# Patient Record
Sex: Female | Born: 1955 | Race: White | Hispanic: No | Marital: Married
Health system: Midwestern US, Community
[De-identification: ages and names within clinical notes are randomized; demographics above are authoritative.]

## PROBLEM LIST (undated history)

## (undated) DIAGNOSIS — M130 Polyarthritis, unspecified: Secondary | ICD-10-CM

---

## 2010-12-07 LAB — LIPID PANEL
Chol/HDL Ratio: 3.5 (ref <5.0)
Cholesterol: 211 mg/dL — ABNORMAL HIGH (ref <200)
HDL: 61 mg/dL (ref >40)
LDL Cholesterol: 124 mg/dL — ABNORMAL HIGH (ref <100)
Triglycerides: 128 mg/dL (ref <150)

## 2010-12-07 LAB — CBC WITH DIFFERENTIAL
Absolute Eos #: 0.2 10*3/uL (ref 0.0–0.4)
Absolute Lymph #: 1.8 10*3/uL (ref 1.0–4.8)
Absolute Mono #: 0.5 10*3/uL (ref 0.1–1.2)
Absolute Neut #: 3.7 10*3/uL (ref 1.8–7.7)
Basophils Absolute: 0 10*3/uL (ref 0.0–0.2)
Basophils: 1 % (ref 0–2)
Eosinophils %: 3 % (ref 1–4)
Hematocrit: 39.4 % (ref 36–46)
Hemoglobin: 13.1 g/dL (ref 12.0–16.0)
Lymphocytes: 29 % (ref 24–44)
MCH: 31.7 pg (ref 26–34)
MCHC: 33.2 g/dL (ref 31–37)
MCV: 95.5 fL (ref 80–100)
MPV: 9.7 fL (ref 6.0–12.0)
Monocytes: 8 % (ref 2–11)
Platelets: 171 10*3/uL (ref 140–450)
RBC: 4.13 m/uL (ref 4.0–5.2)
RDW: 14.2 % (ref 12.5–15.4)
Seg Neutrophils: 59 % (ref 36–66)
WBC: 6.3 10*3/uL (ref 3.5–11.0)

## 2010-12-07 LAB — COMPREHENSIVE METABOLIC PANEL
ALT: 16 U/L (ref 4–40)
AST: 22 U/L (ref 8–36)
Albumin/Globulin Ratio: 1.5 (ref 1.0–2.7)
Albumin: 4.1 g/dL (ref 3.4–4.8)
Alkaline Phosphatase: 67 U/L (ref 25–100)
Anion Gap: 8 mmol/L (ref 8–16)
BUN: 17 mg/dL (ref 6–20)
CO2: 31 mmol/L (ref 20–31)
Calcium: 9.1 mg/dL (ref 8.6–10.4)
Chloride: 106 mmol/L (ref 98–110)
Creatinine: 0.62 mg/dL (ref 0.4–1.0)
GFR African American: >60 mL/min (ref >60)
GFR Non-African American: >60 mL/min (ref >60)
Glucose: 84 mg/dL (ref 74–106)
Potassium: 4.4 mmol/L (ref 3.5–5.1)
Protein, Total: 6.8 g/dL (ref 6.4–8.3)
Sodium: 140 mmol/L (ref 136–145)
Total Bilirubin: 0.47 mg/dL (ref 0.3–1.2)

## 2010-12-07 LAB — T4, FREE: Thyroxine, Free: 1.14 ng/dL (ref 0.80–2.00)

## 2010-12-07 LAB — TSH: TSH: 1.45 mIU/L (ref 0.35–5.50)

## 2010-12-07 MED ORDER — DICLOFENAC SODIUM 75 MG PO TBEC
75 MG | ORAL_TABLET | Freq: Two times a day (BID) | ORAL | Status: DC
Start: 2010-12-07 — End: 2011-06-17

## 2010-12-07 NOTE — Patient Instructions (Signed)
Thank you for enrolling in MyChart. Please follow the instructions below to securely access your online medical record. MyChart allows you to send messages to your doctor, view your test results, renew your prescriptions, schedule appointments, and more.     How Do I Sign Up?  1. In your Internet browser, go to [email or web address]  2. Click on the Sign Up Now link in the Sign In box. You will see the New Member Sign Up page.  3. Enter your MyChart Access Code exactly as it appears below. You will not need to use this code after you???ve completed the sign-up process. If you do not sign up before the expiration date, you must request a new code.  MyChart Access Code: [email or web address]  Expires: 02/05/2011  8:59 AM    4. Enter your Social Security Number ([id number]) and Date of Birth (mm/dd/yyyy) as indicated and click Submit. You will be taken to the next sign-up page.  5. Create a MyChart ID. This will be your MyChart login ID and cannot be changed, so think of one that is secure and easy to remember.  6. Create a MyChart password. You can change your password at any time.  7. Enter your Password Reset Question and Answer. This can be used at a later time if you forget your password.   8. Enter your e-mail address. You will receive e-mail notification when new information is available in MyChart.  9. Click Sign Up. You can now view your medical record.     Additional Information  If you have questions, please contact your physician practice where you receive care. Remember, MyChart is NOT to be used for urgent needs. For medical emergencies, dial 911.

## 2010-12-07 NOTE — Progress Notes (Signed)
Subjective:      Patient ID: Rachael Mason is a 55 y.o. female.    HPI  Has been dieting on [employer] diet. Lost about 40 #. Feels good, some OA s/s.  Review of Systems   Constitutional: Negative.  Negative for unexpected weight change (purposeful dieting).   Respiratory: Negative.    Cardiovascular: Negative.    Gastrointestinal: Negative.    Genitourinary: Negative.    Musculoskeletal: Negative.    Skin: Negative.        Objective:   Physical Exam   Constitutional: She is oriented to person, place, and time. She appears well-developed.   HENT:   Head: Normocephalic.   Cardiovascular: Normal rate and normal heart sounds.    Pulmonary/Chest: Breath sounds normal.   Abdominal: Bowel sounds are normal.   Musculoskeletal: Normal range of motion. She exhibits no edema and no tenderness.   Neurological: She is alert and oriented to person, place, and time. She has normal reflexes.   Psychiatric: She has a normal mood and affect.       Assessment:      1. OA (osteoarthritis)    2. General medical exam    3. Weight loss    4. Palpitations            Plan:    Check labs  Renew diclofenac 75 bid

## 2010-12-09 NOTE — Telephone Encounter (Signed)
Informed lab results. Keep up current regimen.

## 2010-12-09 NOTE — Telephone Encounter (Signed)
Message copied by Lora Paula on Thu Dec 09, 2010  3:06 PM  ------       Message from: Rayne Du A       Created: Wed Dec 08, 2010  8:05 AM         Verall good labs. Cholest 211, but very good HDL. Keep up current regimen.

## 2011-06-17 MED ORDER — DICLOFENAC SODIUM 75 MG PO TBEC
75 MG | ORAL_TABLET | ORAL | Status: DC
Start: 2011-06-17 — End: 2012-03-05

## 2012-03-05 MED ORDER — DICLOFENAC SODIUM 75 MG PO TBEC
75 MG | ORAL_TABLET | ORAL | Status: DC
Start: 2012-03-05 — End: 2013-03-29

## 2012-08-03 MED ORDER — CEPHALEXIN 500 MG PO CAPS
500 MG | ORAL_CAPSULE | Freq: Three times a day (TID) | ORAL | Status: DC
Start: 2012-08-03 — End: 2013-01-30

## 2012-08-03 NOTE — Telephone Encounter (Signed)
Patient advised

## 2012-08-03 NOTE — Telephone Encounter (Signed)
Patient coughing up yellowish-green phlegm, glands are swollen, sore throat, ear pain. Please advise.    [facility]   [phone]

## 2013-01-29 MED ORDER — BENZONATATE 200 MG PO CAPS
200 MG | ORAL_CAPSULE | Freq: Three times a day (TID) | ORAL | Status: AC | PRN
Start: 2013-01-29 — End: 2013-02-08

## 2013-01-29 NOTE — Telephone Encounter (Signed)
Coughing up and blowing out green/yellow mucus, ears/teeth hurt, head congestion, sore throat.  Symptoms started Saturday 01-26-13.  Requesting an RX for cough syrup with codeine.    [facility], [place]

## 2013-01-29 NOTE — Telephone Encounter (Signed)
Patient has been notified and Erskine Squibb will make the appointment

## 2013-01-29 NOTE — Telephone Encounter (Signed)
Tessalon is sent in.    Make appt with TAB in am

## 2013-01-30 MED ORDER — HYDROCODONE-HOMATROPINE 5-1.5 MG/5ML PO SYRP
Freq: Four times a day (QID) | ORAL | Status: DC | PRN
Start: 2013-01-30 — End: 2013-02-18

## 2013-01-30 MED ORDER — DOXYCYCLINE HYCLATE 100 MG PO CAPS
100 MG | ORAL_CAPSULE | Freq: Two times a day (BID) | ORAL | Status: DC
Start: 2013-01-30 — End: 2013-02-18

## 2013-01-30 NOTE — Progress Notes (Signed)
Subjective:      Patient ID: Rachael Mason is a 57 y.o. female.    HPI  Sinus, chest congestion. Otalgia, production of yellow green. Discussed.  PMH reviewed.  Review of Systems   Constitutional: Positive for fever, chills and diaphoresis.   HENT: Positive for congestion, sore throat, rhinorrhea, postnasal drip and sinus pressure.    Respiratory: Positive for cough (prod, worse supine.).    Cardiovascular: Negative.    Gastrointestinal: Negative.    Genitourinary: Negative.    Musculoskeletal: Negative.    Skin: Negative.    Neurological: Negative.    Psychiatric/Behavioral: Negative.        Objective:   Physical Exam   Constitutional: She is oriented to person, place, and time. She appears well-developed and well-nourished.   HENT:   Head: Normocephalic and atraumatic.   Right Ear: External ear normal.   Left Ear: External ear normal.   Nose: Nose normal.   Mouth/Throat: Oropharynx is clear and moist.   Eyes: Conjunctivae and EOM are normal. Pupils are equal, round, and reactive to light.   Neck: Normal range of motion. Neck supple.   Cardiovascular: Normal rate, normal heart sounds and intact distal pulses.    Pulmonary/Chest: Effort normal.   Scattered rhonchi     Abdominal: Soft. Bowel sounds are normal.   Musculoskeletal: Normal range of motion.   Neurological: She is alert and oriented to person, place, and time. She has normal reflexes.   Skin: Skin is warm and dry.   Psychiatric: She has a normal mood and affect.       Assessment:       1. Sinusitis    2. ROM (right otitis media)    3. URI (upper respiratory infection)    4. Pharyngitis              Plan:    Must stop smoking  Hycodan liq  Doxycycline 100 BID 10 D

## 2013-01-30 NOTE — Progress Notes (Signed)
Have you seen any other physician or provider since your last visitno-     Have you had any other diagnostic tests since your last visit? no    Have you changed or stopped any medications since your last visit including any over-the-counter medicines, vitamins, or herbal medicines? no     Are you taking all your prescribed medications? Yes  If NO, why? -  N/A           Patient Self-Management Goal for this visit.   What is your goal for your visit today? - symptom relief   Barriers to success: none   Plan for overcoming my barriers: N/A      Confidence: 10/10   Date goal set: 01/30/2013   Date expected to reach goal: 1day      Health Maintenance Due   Topic Date Due   ??? TETANUS VACCINE ADULT (11 YEARS AND UP)  08/21/1965   ??? CERVICAL CANCER SCREENING  08/22/1975   ??? BREAST CANCER SCREENING  08/22/1994   ??? FLU VACCINE YEARLY (ADULT)  01/21/2013

## 2013-02-18 LAB — COMPREHENSIVE METABOLIC PANEL
ALT: 9 U/L (ref 5–33)
AST: 23 U/L (ref <32)
Albumin/Globulin Ratio: 1.4 (ref 1.0–2.5)
Albumin: 4.3 g/dL (ref 3.5–5.2)
Alkaline Phosphatase: 65 U/L (ref 35–104)
Anion Gap: 17 mmol/L — ABNORMAL HIGH (ref 8–16)
BUN: 15 mg/dL (ref 6–20)
CO2: 23 mmol/L (ref 20–31)
Calcium: 9.1 mg/dL (ref 8.6–10.4)
Chloride: 101 mmol/L (ref 98–107)
Creatinine: 0.55 mg/dL (ref 0.50–0.90)
GFR African American: >60 mL/min (ref >60)
GFR Non-African American: >60 mL/min (ref >60)
Glucose: 85 mg/dL (ref 70–99)
Potassium: 4.3 mmol/L (ref 3.5–5.1)
Sodium: 137 mmol/L (ref 133–142)
Total Bilirubin: 0.4 mg/dL (ref 0.3–1.2)
Total Protein: 7.4 g/dL (ref 6.4–8.3)

## 2013-02-18 LAB — LIPID PANEL
Chol/HDL Ratio: 2.8 (ref <5)
Cholesterol: 210 mg/dL — ABNORMAL HIGH (ref <200)
HDL: 75 mg/dL (ref >40)
LDL Cholesterol: 112 mg/dL (ref 0–130)
Triglycerides: 115 mg/dL (ref <150)

## 2013-02-18 MED ORDER — ESTROGENS, CONJUGATED 0.625 MG/GM VA CREA
0.625 MG/GM | VAGINAL | Status: DC
Start: 2013-02-18 — End: 2013-03-26

## 2013-02-18 NOTE — Progress Notes (Signed)
Subjective:      Patient ID: Rachael Mason is a 57 y.o. female.    HPI  Here for annual exam and Pap. Discussed bone density. Also, dyspareunia in recent times - is 6 years post-menpausal. Notes hip pain. Discussed.  PMH reviewed.  Review of Systems   Constitutional: Negative.  Negative for fatigue.   HENT: Negative.    Respiratory: Negative.    Cardiovascular: Negative.    Gastrointestinal: Negative.    Genitourinary: Negative.    Musculoskeletal: Positive for arthralgias (bilateral hips - seems to be less painful when exercises.).   Skin: Negative.    Neurological: Negative.    Psychiatric/Behavioral: Negative.        Objective:   Physical Exam   Constitutional: She is oriented to person, place, and time. She appears well-developed and well-nourished.   HENT:   Head: Normocephalic and atraumatic.   Right Ear: External ear normal.   Left Ear: External ear normal.   Nose: Nose normal.   Mouth/Throat: Oropharynx is clear and moist.   Eyes: Conjunctivae and EOM are normal. Pupils are equal, round, and reactive to light.   Neck: Normal range of motion. Neck supple.   Cardiovascular: Normal rate, normal heart sounds and intact distal pulses.    Pulmonary/Chest: Effort normal and breath sounds normal.   Abdominal: Soft. Bowel sounds are normal.   Genitourinary: Vagina normal. No vaginal discharge found.   Some thinning of vaginal tissue      Musculoskeletal: Normal range of motion.   Neurological: She is alert and oriented to person, place, and time. She has normal reflexes.   Skin: Skin is warm.   Psychiatric: She has a normal mood and affect. Thought content normal.       Assessment:       1. Well adult exam     2. Screening  PAP SMEAR    MAM Screen Routine   3. Osteopenia  DEXA Bone Density 2 Sites   4. Hyperlipidemia  Lipid Panel    Comprehensive Metabolic Panel   5. Papanicolaou smear, as part of routine gynecological examination               Plan:    Prem vag cream 2-3 x weekly.  Mammogram   DEXA scan  Check  labs  Flu Shot

## 2013-02-18 NOTE — Progress Notes (Signed)
Have you seen any other physician or provider since your last visit no    Have you had any other diagnostic tests since your last visit? no    Have you changed or stopped any medications since your last visit including any over-the-counter medicines, vitamins, or herbal medicines? no     Are you taking all your prescribed medications? Yes  If NO, why? -             Patient Self-Management Goal for this visit.   What is your goal for your visit today? - pap and PE   Barriers to success: none   Plan for overcoming my barriers: N/A      Confidence: 10/10   Date goal set: 02/18/2013   Date expected to reach goal: today      Health Maintenance Due   Topic Date Due   ??? TETANUS VACCINE ADULT (11 YEARS AND UP)  08/21/1965   ??? CERVICAL CANCER SCREENING  08/22/1975   ??? BREAST CANCER SCREENING  08/22/1994   ??? FLU VACCINE YEARLY (ADULT)  01/21/2013

## 2013-03-04 LAB — GYN CYTOLOGY

## 2013-03-06 MED ORDER — ALENDRONATE SODIUM 70 MG PO TABS
70 MG | ORAL_TABLET | ORAL | Status: DC
Start: 2013-03-06 — End: 2013-03-26

## 2013-03-26 MED ORDER — ESTROGENS, CONJUGATED 0.625 MG/GM VA CREA
0.625 MG/GM | VAGINAL | Status: DC
Start: 2013-03-26 — End: 2014-03-20

## 2013-03-26 MED ORDER — ALENDRONATE SODIUM 70 MG PO TABS
70 MG | ORAL_TABLET | ORAL | Status: DC
Start: 2013-03-26 — End: 2014-02-11

## 2013-03-29 MED ORDER — DICLOFENAC SODIUM 75 MG PO TBEC
75 MG | ORAL_TABLET | ORAL | Status: DC
Start: 2013-03-29 — End: 2014-03-01

## 2014-02-11 MED ORDER — ALENDRONATE SODIUM 70 MG PO TABS
70 MG | ORAL_TABLET | ORAL | Status: DC
Start: 2014-02-11 — End: 2014-03-20

## 2014-02-11 NOTE — Telephone Encounter (Signed)
02/18/2013 LV

## 2014-03-03 MED ORDER — DICLOFENAC SODIUM 75 MG PO TBEC
75 MG | ORAL_TABLET | Freq: Two times a day (BID) | ORAL | Status: DC
Start: 2014-03-03 — End: 2014-03-20

## 2014-03-03 NOTE — Telephone Encounter (Signed)
02/18/2013 LV

## 2014-03-05 MED ORDER — AMOXICILLIN 875 MG PO TABS
875 MG | ORAL_TABLET | Freq: Two times a day (BID) | ORAL | Status: DC
Start: 2014-03-05 — End: 2014-08-07

## 2014-03-05 NOTE — Telephone Encounter (Signed)
Chest and head congestion, bad cough green phlegm.  [facility]  [phone]

## 2014-03-20 ENCOUNTER — Ambulatory Visit: Admit: 2014-03-20 | Discharge: 2014-03-20 | Payer: BLUE CROSS/BLUE SHIELD | Attending: Family Medicine

## 2014-03-20 DIAGNOSIS — Z0000 Encounter for general adult medical examination without abnormal findings: Secondary | ICD-10-CM

## 2014-03-20 MED ORDER — DICLOFENAC SODIUM 75 MG PO TBEC
75 MG | ORAL_TABLET | Freq: Two times a day (BID) | ORAL | Status: DC
Start: 2014-03-20 — End: 2015-08-18

## 2014-03-20 MED ORDER — HYDROCODONE-ACETAMINOPHEN 5-325 MG PO TABS
5-325 MG | ORAL_TABLET | Freq: Every evening | ORAL | Status: DC | PRN
Start: 2014-03-20 — End: 2015-11-11

## 2014-03-20 MED ORDER — ALENDRONATE SODIUM 70 MG PO TABS
70 MG | ORAL_TABLET | ORAL | Status: DC
Start: 2014-03-20 — End: 2015-11-11

## 2014-03-20 NOTE — Progress Notes (Signed)
Subjective:      Patient ID: Rachael Mason is a 58 y.o. female.    HPI  Annual Exam - was ill with a sinus and chest cold. Completed course of Amoxil. Resolved. Bilateral hip pain and stiffness - worsening over the past couple years. Trouble sleeping. Diclofenac will help some.  Discussed.    Review of Systems:   Constitutional: no fever or weight loss    Eyes: no eye pain or blurred vision  ENT: no hearing loss, congestion, or difficulty swallowing  Respiratory: no wheezing, chest tightness, or shortness of breath    Cardiovascular: no chest pain or pressure, no palpitations, no diaphoresis   Gastrointestinal: no nausea, vomiting, or diarrhea     Genitourinary: no dysuria, frequency, or nocturia    Musculoskeletal: Bilateral hip pain and stiffness, steadily increasing over the past several months.    Skin: no rashes or sores   Neurological: no weakness, numbness, tingling, or headache   Hematological: no bleeding gums or easy bruising   Psychiatric/Behavioral: no depression or anxiety      Allergies; medications; past medical, surgical, family, and social history; and problem list reviewed as indicated in this encounter.      Physical Exam:   Vitals: Blood pressure 120/80, pulse 88, height 5\' 6"  (1.676 m), weight 137 lb (62.143 kg), SpO2 98 %.  Constitutional: She is oriented to person, place, and time.  She appears well-developed and well-nourished and in no acute distress.   Head: Normocephalic and atraumatic.   Eyes: EOM are normal. Pupils are equal, round, and reactive to light.   Right Ear: External ear normal.   Left Ear: External ear normal.   Nose: pink, non-edematous mucosa.   Throat: no erythema, tonsillar hypertrophy or exudate  Neck: Normal range of motion. Neck supple. No tracheal deviation present.   Cardiovascular: Normal rate and regular rhythm, no murmur, rub, or gallop    Pulmonary/Chest: Effort normal and breath sounds normal. No rales or wheezes.  No chest retraction.  Abdomen: Soft. No  tenderness.   Musculoskeletal: Normal gait and station.  Bilateral lateral hip area tenderness and stiffness with range of motion.  Extremities: no clubbing, cyanosis, or edema   Neurological: CN II-XII grossly intact, no focal neurological deficits   Skin: Skin is warm and dry.  No obvious lesion on exposed skin.   Psychiatric: mood appears euthymic, affect appears normal, she does not appear to be responding to internal stimuli.    Review of Systems    Objective:   Physical Exam    Assessment:       1. Well adult exam     2. Pain of both hip joints  XR Hip Bilateral Standard W Ap Pelvis   3. Primary osteoarthritis of both hips     4. Osteopenia           Plan:   Current meds. Renew.  Advised quitting smoking - down to about 1/2 ppd.  Baseline hip xrays.   Norco HS  ROM and stretching exercises.

## 2014-03-20 NOTE — Progress Notes (Signed)
Have you seen any other physician or provider since your last visit no    Have you had any other diagnostic tests since your last visit? no    Have you changed or stopped any medications since your last visit including any over-the-counter medicines, vitamins, or herbal medicines? no     Are you taking all your prescribed medications? Yes  If NO, why? -  N/A           Patient Self-Management Goal for this visit.   What is your goal for your visit today? - physical   Barriers to success: none   Plan for overcoming my barriers: N/A      Confidence: 10/10   Date goal set: 03/20/14   Date expected to reach goal: 1day      Health Maintenance Due   Topic Date Due   ??? TETANUS VACCINE ADULT (11 YEARS AND UP)  08/21/1965   ??? PNEUMOVAX 1 DOSE 19-64Y (1) 08/21/1972   ??? FLU VACCINE YEARLY (ADULT)  12/21/2013   ??? BREAST CANCER SCREENING  03/05/2014

## 2014-08-07 MED ORDER — AMOXICILLIN 875 MG PO TABS
875 MG | ORAL_TABLET | Freq: Two times a day (BID) | ORAL | Status: DC
Start: 2014-08-07 — End: 2015-07-27

## 2014-08-07 NOTE — Telephone Encounter (Signed)
Patient reports sore throat, swollen glands in neck, yellow nasal drainage, sinus pressure, productive cough for 3 days. Patient has taken Sudafed. She has NKDA. She is requesting [facility] at[facility] [place]

## 2015-07-27 MED ORDER — AMOXICILLIN 875 MG PO TABS
875 MG | ORAL_TABLET | Freq: Two times a day (BID) | ORAL | 1 refills | Status: DC
Start: 2015-07-27 — End: 2015-11-11

## 2015-07-27 NOTE — Telephone Encounter (Signed)
Amoxicillin sent to her phcy.

## 2015-07-27 NOTE — Telephone Encounter (Signed)
PT IS IN [place].  SHE HAS HEAD CONGESTION DOWN INTO CHEST. HAS BEEN TAKING DAYQUIL, NYQUIL.  SHE IS COUGHING, HAS FEVER OFF AND  ON.  SHE HAS SHARP HEADACE PAIN, SHE CAN'T SEE VERY WELL. WILL YOU CALL SOMETHING IN FOR HER?  [facility] [phone]

## 2015-07-27 NOTE — Telephone Encounter (Signed)
NO ANSWER, PT WAS ADVISED TO CALL PHARMACY AROUND NOON TODAY

## 2015-08-18 MED ORDER — DICLOFENAC SODIUM 75 MG PO TBEC
75 MG | ORAL_TABLET | ORAL | 0 refills | Status: DC
Start: 2015-08-18 — End: 2017-08-10

## 2015-08-18 NOTE — Telephone Encounter (Signed)
Health Maintenance   Topic Date Due   ??? Hepatitis C screen  [birth year]   ??? HIV screen  08/21/1969   ??? DTaP/Tdap/Td vaccine (1 - Tdap) 08/21/1973   ??? Pneumococcal med risk (1 of 1 - PPSV23) 08/21/1973   ??? Zostavax vaccine  08/22/2014   ??? Colon cancer screen colonoscopy  10/01/2014   ??? Flu vaccine (1) 12/22/2014   ??? Breast cancer screen  03/06/2015   ??? Cervical cancer screen  02/19/2016   ??? Lipid screen  02/18/2018       No results found for: LABA1C          ( goal A1C is < 7)   No results found for: LABMICR  LDL Cholesterol (mg/dL)   Date Value   19/14/782909/29/2014 112       (goal LDL is <100)   AST (U/L)   Date Value   02/18/2013 23     ALT (U/L)   Date Value   02/18/2013 9     BUN (mg/dL)   Date Value   56/21/308609/29/2014 15     BP Readings from Last 3 Encounters:   03/20/14 120/80   02/18/13 122/66   01/30/13 110/70          (goal 120/80)    All Future Testing planned in [facility]      Next Visit Date:  No future appointments.         Patient Active Problem List:     Osteopenia     Primary osteoarthritis of both hips

## 2015-11-11 ENCOUNTER — Ambulatory Visit: Admit: 2015-11-11 | Discharge: 2015-11-11 | Payer: BLUE CROSS/BLUE SHIELD

## 2015-11-11 DIAGNOSIS — M8580 Other specified disorders of bone density and structure, unspecified site: Secondary | ICD-10-CM

## 2015-11-11 MED ORDER — OSPEMIFENE 60 MG PO TABS
60 MG | ORAL_TABLET | Freq: Every day | ORAL | 3 refills | Status: DC
Start: 2015-11-11 — End: 2016-03-09

## 2015-11-11 MED ORDER — HYDROCODONE-ACETAMINOPHEN 5-325 MG PO TABS
5-325 MG | ORAL_TABLET | Freq: Three times a day (TID) | ORAL | 0 refills | Status: DC | PRN
Start: 2015-11-11 — End: 2017-03-06

## 2015-11-11 NOTE — Progress Notes (Signed)
Subjective:      Patient ID: Rachael Mason is a 60 y.o. female.    HPI  Here for follow up of hip pain (which is well controlled with the voltaren)  and has some concerns about screening and preventive testing.      Review of Systems   Constitutional: Negative for activity change, appetite change, chills, diaphoresis, fatigue, fever and unexpected weight change.   HENT: Negative for congestion, ear pain, facial swelling, hearing loss, postnasal drip, rhinorrhea, sinus pressure, sore throat and voice change.    Eyes: Negative for photophobia, pain, discharge, redness, itching and visual disturbance.   Respiratory: Negative for cough, chest tightness, shortness of breath and wheezing.    Cardiovascular: Negative for chest pain and palpitations.   Gastrointestinal: Negative for abdominal distention, abdominal pain, blood in stool, constipation, diarrhea, nausea and vomiting.   Endocrine: Negative for cold intolerance and heat intolerance.   Genitourinary: Negative for decreased urine volume, difficulty urinating, dysuria, flank pain, frequency, hematuria, pelvic pain, urgency, vaginal bleeding and vaginal discharge.   Musculoskeletal: Positive for arthralgias. Negative for back pain, gait problem, joint swelling, myalgias, neck pain and neck stiffness.   Skin: Negative for color change, pallor and rash.   Allergic/Immunologic: Negative for environmental allergies and food allergies.   Neurological: Negative for dizziness, tremors, seizures, syncope, facial asymmetry, speech difficulty, weakness, numbness and headaches.   Psychiatric/Behavioral: Negative for agitation, behavioral problems, dysphoric mood and sleep disturbance. The patient is not nervous/anxious and is not hyperactive.        Objective:   Physical Exam   Constitutional: She is oriented to person, place, and time. She appears well-developed and well-nourished. She does not appear ill. No distress.   HENT:   Head: Normocephalic and atraumatic.   Right  Ear: External ear normal.   Left Ear: External ear normal.   Nose: Nose normal. No mucosal edema or rhinorrhea.   Mouth/Throat: Mucous membranes are normal. No oropharyngeal exudate, posterior oropharyngeal edema or posterior oropharyngeal erythema.   Eyes: EOM are normal. Pupils are equal, round, and reactive to light. Right eye exhibits no discharge. Left eye exhibits no discharge. No scleral icterus.   Neck: Trachea normal and normal range of motion. Neck supple. No JVD present. Carotid bruit is not present. No tracheal deviation present. No thyromegaly present.   Cardiovascular: Normal rate, regular rhythm, S1 normal, S2 normal, normal heart sounds and normal pulses.  Exam reveals no gallop, no S3, no S4, no distant heart sounds and no friction rub.    No murmur heard.  Pulmonary/Chest: No respiratory distress. She has no decreased breath sounds. She has no wheezes. She has no rhonchi. She has no rales.   Abdominal: Soft. Normal appearance and bowel sounds are normal. There is no hepatosplenomegaly. There is no tenderness. There is no CVA tenderness. No hernia.   Musculoskeletal:        Right hip: She exhibits decreased range of motion, decreased strength, tenderness and crepitus. She exhibits no deformity.        Left hip: She exhibits decreased range of motion, decreased strength, tenderness and crepitus. She exhibits no deformity.   Lymphadenopathy:     She has no cervical adenopathy.        Right cervical: No superficial cervical adenopathy present.       Left cervical: No superficial cervical adenopathy present.   Neurological: She is alert and oriented to person, place, and time. She has normal strength. No cranial nerve deficit or sensory  deficit. Coordination and gait normal.   Reflex Scores:       Brachioradialis reflexes are 2+ on the right side and 2+ on the left side.       Patellar reflexes are 2+ on the right side and 2+ on the left side.       Achilles reflexes are 2+ on the right side and 2+ on  the left side.  Skin: Skin is warm and dry. No lesion and no rash noted. She is not diaphoretic. No cyanosis. Nails show no clubbing.   Psychiatric: She has a normal mood and affect. Her speech is normal and behavior is normal. Judgment and thought content normal. Cognition and memory are normal.       Assessment:          Plan:      1. Osteopenia  - DEXA BONE DENSITY 2 SITES; Future    2. Screen for colon cancer  - External Referral To Gastroenterology    3. Pain of both hip joints  Refilled norco for TID dosing.

## 2015-11-11 NOTE — Progress Notes (Signed)
Subjective:      Patient ID: Rachael Mason is a 60 y.o. female.  Visit Information    Have you changed or started any medications since your last visit including any over-the-counter medicines, vitamins, or herbal medicines? no   Are you having any side effects from any of your medications? -  no  Have you stopped taking any of your medications? Is so, why? -  no    Have you seen any other physician or provider since your last visit? No  Have you had any other diagnostic tests since your last visit? No  Have you been seen in the emergency room and/or had an admission to a hospital since we last saw you? No  Have you had your routine dental cleaning in the past 6 months? yes -     Have you activated your MyChart account? If not, what are your barriers? No:      Patient Care Team:  Lenn SinkBradley P Everly, MD as PCP - General    Medical History Review  Past Medical, Family, and Social History reviewed and  contribute to the patient presenting condition    Health Maintenance   Topic Date Due   ??? Hepatitis C screen  08-09-1955   ??? HIV screen  08/21/1969   ??? DTaP/Tdap/Td vaccine (1 - Tdap) 08/21/1973   ??? Pneumococcal med risk (1 of 1 - PPSV23) 08/21/1973   ??? Zostavax vaccine  08/22/2014   ??? Colon cancer screen colonoscopy  10/01/2014   ??? Breast cancer screen  03/06/2015   ??? Flu vaccine (Season Ended) 12/22/2015   ??? Cervical cancer screen  02/19/2016   ??? Lipid screen  02/18/2018       HPI    Review of Systems    Objective:   Physical Exam    Assessment / Plan:

## 2015-11-11 NOTE — Patient Instructions (Signed)
In order to reduce smoking try the following:  -Find a place for your cigarettes to "live" and make sure they always stay there and at the same time pick a separate place where you smoke them.  The idea here is that you never want them to be easily accessible so that you actually have to get up and walk somewhere to get one if you want it.    -Try to keep something handy that you can keep in your mouth for instance a toothpick or better yet a bag of dum dum pops that you can keep in your mouth to take the place of the cigarette.    -third, before each cigarette drink about 6-8 ounces of water.  There are two reasons to this, one is that dehydration causes nicotine cravings and the other is that because the average nicotine craving lasts about 2 minutes, in the time it takes to get the drink, you may not want the cigarette anymore.

## 2016-02-08 MED ORDER — CEFDINIR 300 MG PO CAPS
300 MG | ORAL_CAPSULE | Freq: Two times a day (BID) | ORAL | 0 refills | Status: AC
Start: 2016-02-08 — End: 2016-02-18

## 2016-02-08 MED ORDER — PREDNISONE 50 MG PO TABS
50 MG | ORAL_TABLET | Freq: Every day | ORAL | 0 refills | Status: AC
Start: 2016-02-08 — End: 2016-02-18

## 2016-02-08 NOTE — Telephone Encounter (Signed)
Made patient aware

## 2016-02-08 NOTE — Telephone Encounter (Signed)
Submitted.

## 2016-02-08 NOTE — Telephone Encounter (Signed)
she is wanting to know if you can call something in [facility] Friday coughin up green and yellow please advise

## 2016-03-09 ENCOUNTER — Ambulatory Visit: Admit: 2016-03-09 | Discharge: 2016-03-09 | Payer: BLUE CROSS/BLUE SHIELD

## 2016-03-09 DIAGNOSIS — Z0000 Encounter for general adult medical examination without abnormal findings: Secondary | ICD-10-CM

## 2016-03-09 MED ORDER — ESTRADIOL 0.1 MG/GM VA CREA
0.1 | VAGINAL | 3 refills | Status: AC
Start: 2016-03-09 — End: ?

## 2016-03-09 NOTE — Progress Notes (Signed)
Subjective:      Patient ID: Rachael Mason is a 60 y.o. female.    HPI   Here for annual well check and has been generally well and healthy and has no current concerns or complaints.      She has continued to have dyspareunia and dryness.       Her diet has been generally healthy and has been staying well hydrated.  She has been sleeping well and has been staying active.       Review of Systems   Constitutional: Negative for activity change, appetite change, chills, diaphoresis, fatigue, fever and unexpected weight change.   HENT: Positive for congestion. Negative for ear pain, facial swelling, hearing loss, postnasal drip, rhinorrhea, sinus pressure, sore throat and voice change.    Eyes: Negative for photophobia, pain, discharge, redness, itching and visual disturbance.   Respiratory: Positive for cough. Negative for chest tightness, shortness of breath and wheezing.    Cardiovascular: Negative for chest pain and palpitations.   Gastrointestinal: Negative for abdominal distention, abdominal pain, blood in stool, constipation, diarrhea, nausea and vomiting.   Endocrine: Negative for cold intolerance and heat intolerance.   Genitourinary: Negative for decreased urine volume, difficulty urinating, dysuria, flank pain, frequency, hematuria, pelvic pain, urgency, vaginal bleeding and vaginal discharge.   Musculoskeletal: Negative for arthralgias, back pain, gait problem, joint swelling, myalgias, neck pain and neck stiffness.   Skin: Negative for color change, pallor and rash.   Allergic/Immunologic: Negative for environmental allergies and food allergies.   Neurological: Negative for dizziness, tremors, seizures, syncope, facial asymmetry, speech difficulty, weakness, numbness and headaches.   Psychiatric/Behavioral: Negative for agitation, behavioral problems, dysphoric mood and sleep disturbance. The patient is not nervous/anxious and is not hyperactive.        Objective:   Physical Exam   Constitutional: She is  oriented to person, place, and time. She appears well-developed and well-nourished. She does not appear ill. No distress.   HENT:   Head: Normocephalic and atraumatic.   Right Ear: External ear normal.   Left Ear: External ear normal.   Nose: Nose normal. No mucosal edema or rhinorrhea.   Mouth/Throat: Mucous membranes are normal. No oropharyngeal exudate, posterior oropharyngeal edema or posterior oropharyngeal erythema.   Eyes: EOM are normal. Pupils are equal, round, and reactive to light. Right eye exhibits no discharge. Left eye exhibits no discharge. No scleral icterus.   Neck: Trachea normal and normal range of motion. Neck supple. No JVD present. Carotid bruit is not present. No tracheal deviation present. No thyromegaly present.   Cardiovascular: Normal rate, regular rhythm, S1 normal, S2 normal, normal heart sounds and normal pulses.  Exam reveals no gallop, no S3, no S4, no distant heart sounds and no friction rub.    No murmur heard.  Pulmonary/Chest: No respiratory distress. She has no decreased breath sounds. She has no wheezes. She has no rhonchi. She has no rales.   Abdominal: Soft. Normal appearance and bowel sounds are normal. There is no hepatosplenomegaly. There is no tenderness. There is no CVA tenderness. No hernia.   Lymphadenopathy:     She has no cervical adenopathy.        Right cervical: No superficial cervical adenopathy present.       Left cervical: No superficial cervical adenopathy present.   Neurological: She is alert and oriented to person, place, and time. She has normal strength. No cranial nerve deficit or sensory deficit. Coordination and gait normal.   Reflex Scores:  Brachioradialis reflexes are 2+ on the right side and 2+ on the left side.       Patellar reflexes are 2+ on the right side and 2+ on the left side.       Achilles reflexes are 2+ on the right side and 2+ on the left side.  Skin: Skin is warm and dry. No lesion and no rash noted. She is not diaphoretic. No  cyanosis. Nails show no clubbing.   Psychiatric: She has a normal mood and affect. Her speech is normal and behavior is normal. Judgment and thought content normal. Cognition and memory are normal.          Vitals:    03/09/16 1344   BP: 120/72   Pulse: 68   Weight: 139 lb 6.4 oz (63.2 kg)   Height: 5' 4.5" (1.638 m)       Assessment:          Plan:      1. Routine general medical examination at a health care facility  - I generally recommend that people of all ages try to get 150 minutes of physical activity per week and it doesn???t matter how this totals up, in other words 30 minutes 5 days per week is as good as 50 minutes 3 days a week and so on.  The level of activity should be such that it is able to get your heart rate up to 100 or more, for example a brisk walk should achieve this rate.   - In terms of diet, I generally recommend trying to avoid processed foods wherever possible (anything that comes in a can or a box) which can be achieved by sticking to the outside walls of the grocery store where generally you will find fresh fruits/vegetables, meats, dairy, and frozen foods.    - Try to avoid starches in the diet where possible and minimize bread, rice, potatoes, and pasta in the diet.  Specifically try to avoid gluten, which even in people that don???t have a frank allergy, causes havoc in the small intestine and alters absorption of nutrients which can in turn lead to obesity.   - Try to achieve a regular sleep schedule, waking and laying down at the same time each night.  Most people need 7 hours per night plus or minus 2 hours.  You will know that you???re getting enough because you will wake feeling refreshed and not need to sleep in to catch up on weekends.   - Make sure that you don???t neglect your skin, I recommend an SPF 30 or higher sun screen any time that you plan to be in the sun for more than 20 minutes, even in the winter or on cloudy days (keep in mind that UV light penetrates clouds and can  cause burns even on cloudy days).   - Finally, make sure that you always have something to look forward to whether this is a vacation, a special event, or just a weekend off work.  Having something to look forward to helps to maintain positive focus and is good for mental health.       2. Need for immunization against influenza  - INFLUENZA, QUADV, 3 YRS AND OLDER, IM, MDV, 0.5ML (FLUZONE QUADV)    3. Screening for lipid disorders  Ordered lipid profile.     4. Medication monitoring encounter  CMP ordered.

## 2016-03-09 NOTE — Progress Notes (Signed)
Visit Information    Have you changed or started any medications since your last visit including any over-the-counter medicines, vitamins, or herbal medicines? no   Are you having any side effects from any of your medications? -  no  Have you stopped taking any of your medications? Is so, why? -  no    Have you seen any other physician or provider since your last visit? No  Have you had any other diagnostic tests since your last visit? No  Have you been seen in the emergency room and/or had an admission to a hospital since we last saw you? No  Have you had your routine dental cleaning in the past 6 months? yes -     Have you activated your MyChart account? If not, what are your barriers? No:      Patient Care Team:  Lenn SinkBradley P Everly, MD as PCP - General    Medical History Review  Past Medical, Family, and Social History reviewed and  contribute to the patient presenting condition    Health Maintenance   Topic Date Due   ??? Hepatitis C screen  Jul 17, 1955   ??? HIV screen  08/21/1969   ??? DTaP/Tdap/Td vaccine (1 - Tdap) 08/21/1973   ??? Pneumococcal med risk (1 of 1 - PPSV23) 08/21/1973   ??? Zostavax vaccine  08/22/2014   ??? Colon cancer screen colonoscopy  10/01/2014   ??? Breast cancer screen  03/06/2015   ??? Flu vaccine (1) 01/22/2016   ??? Cervical cancer screen  02/19/2016   ??? Lipid screen  02/18/2018

## 2017-03-06 ENCOUNTER — Ambulatory Visit: Admit: 2017-03-06 | Discharge: 2017-03-06 | Payer: BLUE CROSS/BLUE SHIELD

## 2017-03-06 DIAGNOSIS — Z1239 Encounter for other screening for malignant neoplasm of breast: Secondary | ICD-10-CM

## 2017-03-06 MED ORDER — HYDROCODONE-ACETAMINOPHEN 5-325 MG PO TABS
5-325 MG | ORAL_TABLET | Freq: Three times a day (TID) | ORAL | 0 refills | Status: AC | PRN
Start: 2017-03-06 — End: 2017-04-05

## 2017-03-06 MED ORDER — ZOSTER VAC RECOMB ADJUVANTED 50 MCG/0.5ML IM SUSR
50 MCG/0.5ML | Freq: Once | INTRAMUSCULAR | 0 refills | Status: AC
Start: 2017-03-06 — End: 2017-03-06

## 2017-03-06 MED ORDER — HYDROCODONE-ACETAMINOPHEN 5-325 MG PO TABS
5-325 MG | ORAL_TABLET | Freq: Four times a day (QID) | ORAL | 0 refills | Status: DC | PRN
Start: 2017-03-06 — End: 2017-10-18

## 2017-03-06 MED ORDER — HYDROCODONE-ACETAMINOPHEN 5-325 MG PO TABS
5-325 | ORAL_TABLET | Freq: Three times a day (TID) | ORAL | 0 refills | Status: DC | PRN
Start: 2017-03-06 — End: 2017-08-10

## 2017-03-06 NOTE — Telephone Encounter (Signed)
Patient states she went to fill her norco rx and the [facility] would only fill 7 days worth, states that they told her its the state law that they can only fill this amount. She would like to know if this is standard? She said that her husband has had more than that filled at a time but he used his mail away pharmacy. [facility] [place], their number is [phone]. [facility] not [facility] in [facility].

## 2017-03-06 NOTE — Progress Notes (Signed)
Subjective:      Patient ID: Rachael Mason is a 61 y.o. female.    HPI   Here for annual well check and has been generally well and healthy with no current concerns other than she has been having some pain between the shoulder blades.      She has been continuing to smoke but has been averaging only a half pack per day.  She has been maintaining a healthy diet, has been staying well hydrated and has been sleeping well.      Review of Systems   Constitutional: Negative for activity change, appetite change, chills, diaphoresis, fatigue, fever and unexpected weight change.   HENT: Negative for congestion, ear pain, facial swelling, hearing loss, postnasal drip, rhinorrhea, sinus pressure, sore throat and voice change.    Eyes: Negative for photophobia, pain, discharge, redness, itching and visual disturbance.   Respiratory: Negative for cough, chest tightness, shortness of breath and wheezing.    Cardiovascular: Negative for chest pain and palpitations.   Gastrointestinal: Negative for abdominal distention, abdominal pain, blood in stool, constipation, diarrhea, nausea and vomiting.   Endocrine: Negative for cold intolerance and heat intolerance.   Genitourinary: Negative for decreased urine volume, difficulty urinating, dysuria, flank pain, frequency, hematuria, pelvic pain, urgency, vaginal bleeding and vaginal discharge.   Musculoskeletal: Negative for arthralgias, back pain, gait problem, joint swelling, myalgias, neck pain and neck stiffness.   Skin: Negative for color change, pallor and rash.   Allergic/Immunologic: Negative for environmental allergies and food allergies.   Neurological: Negative for dizziness, tremors, seizures, syncope, facial asymmetry, speech difficulty, weakness, numbness and headaches.   Psychiatric/Behavioral: Negative for agitation, behavioral problems, dysphoric mood and sleep disturbance. The patient is not nervous/anxious and is not hyperactive.        Objective:   Physical Exam    Constitutional: She is oriented to person, place, and time. She appears well-developed and well-nourished. She does not appear ill. No distress.   HENT:   Head: Normocephalic and atraumatic.   Right Ear: External ear normal.   Left Ear: External ear normal.   Nose: Nose normal. No mucosal edema or rhinorrhea.   Mouth/Throat: Mucous membranes are normal. No oropharyngeal exudate, posterior oropharyngeal edema or posterior oropharyngeal erythema.   Eyes: Pupils are equal, round, and reactive to light. EOM are normal. Right eye exhibits no discharge. Left eye exhibits no discharge. No scleral icterus.   Neck: Trachea normal and normal range of motion. Neck supple. No JVD present. Carotid bruit is not present. No tracheal deviation present. No thyromegaly present.   Cardiovascular: Normal rate, regular rhythm, S1 normal, S2 normal, normal heart sounds and normal pulses.  Exam reveals no gallop, no S3, no S4, no distant heart sounds and no friction rub.    No murmur heard.  Pulmonary/Chest: No respiratory distress. She has no decreased breath sounds. She has no wheezes. She has no rhonchi. She has no rales.   Abdominal: Soft. Normal appearance and bowel sounds are normal. There is no hepatosplenomegaly. There is no tenderness. There is no CVA tenderness. No hernia.   Lymphadenopathy:     She has no cervical adenopathy.        Right cervical: No superficial cervical adenopathy present.       Left cervical: No superficial cervical adenopathy present.   Neurological: She is alert and oriented to person, place, and time. She has normal strength. No cranial nerve deficit or sensory deficit. Coordination and gait normal.   Reflex Scores:  Brachioradialis reflexes are 2+ on the right side and 2+ on the left side.       Patellar reflexes are 2+ on the right side and 2+ on the left side.       Achilles reflexes are 2+ on the right side and 2+ on the left side.  Skin: Skin is warm and dry. No lesion and no rash noted. She  is not diaphoretic. No cyanosis. Nails show no clubbing.   Psychiatric: She has a normal mood and affect. Her speech is normal and behavior is normal. Judgment and thought content normal. Cognition and memory are normal.     Vitals:    03/06/17 1037   BP: 118/62   Pulse: 76   Weight: 138 lb (62.6 kg)       Assessment / Plan:   1. Breast cancer screening  - [facility] DIGITAL SCREEN SELF REFERRAL W OR WO CAD BILATERAL; Future    2. Encounter for general adult medical examination with abnormal findings  - I generally recommend that people of all ages try to get 150 minutes of physical activity per week and it doesn't matter how this totals up, in other words 30 minutes 5 days per week is as good as 50 minutes 3 days a week and so on.  The level of activity should be such that it is able to get your heart rate up to 100 or more, for example a brisk walk should achieve this rate.   - In terms of diet, I generally recommend trying to avoid processed foods wherever possible (anything that comes in a can or a box) which can be achieved by sticking to the outside walls of the grocery store where generally you will find fresh fruits/vegetables, meats, dairy, and frozen foods.    - Try to avoid starches in the diet where possible and minimize bread, rice, potatoes, and pasta in the diet.  Specifically try to avoid gluten, which even in people that don't have a frank allergy, causes havoc in the small intestine and alters absorption of nutrients which can in turn lead to obesity.   - Try to achieve a regular sleep schedule, waking and laying down at the same time each night.  Most people need 7 hours per night plus or minus 2 hours.  You will know that you're getting enough because you will wake feeling refreshed and not need to sleep in to catch up on weekends.   - Make sure that you don't neglect your skin, I recommend an SPF 30 or higher sun screen any time that you plan to be in the sun for more than 20 minutes, even in the  winter or on cloudy days (keep in mind that UV light penetrates clouds and can cause burns even on cloudy days).   - Finally, make sure that you always have something to look forward to whether this is a vacation, a special event, or just a weekend off work.  Having something to look forward to helps to maintain positive focus and is good for mental health.       3. Osteopenia of multiple sites  Has been maintaining regular physical activity with vitamin D intake.     4. Vitamin D deficiency  Will have her continue with the vitamin D supplement.     5. Screening for lipid disorders  Lipid profile ordered.     6. Fatigue, unspecified type  TSH ordered.     7. Medication monitoring encounter  CMP/CBC orrdered.     8. Immunization due  Flu, TDaP, and prevnar ordered.     9. Screening for breast cancer  Mammogram ordered.     10. Encounter for hepatitis C screening test for low risk patient  Hep C screening ordered.   11. Screening for HIV (human immunodeficiency virus)  HIV screen ordered.     12. Screen for colon cancer  FIT test ordered.

## 2017-03-06 NOTE — Telephone Encounter (Signed)
You can fill up to 300 pills per month maximum but if it's been greater than 100 days since the last Rx was filled then yes, it can only legally be filled for 7 days at a time for the first 90 days of use (which also applies to mail order pharmacies if shipping to an [place] address).

## 2017-03-06 NOTE — Telephone Encounter (Signed)
PT advised

## 2017-03-06 NOTE — Progress Notes (Signed)
Visit Information    Have you changed or started any medications since your last visit including any over-the-counter medicines, vitamins, or herbal medicines? no   Are you having any side effects from any of your medications? -  no  Have you stopped taking any of your medications? Is so, why? -  no    Have you seen any other physician or provider since your last visit? No  Have you had any other diagnostic tests since your last visit? No  Have you been seen in the emergency room and/or had an admission to a hospital since we last saw you? No  Have you had your routine dental cleaning in the past 6 months? no    Have you activated your MyChart account? If not, what are your barriers? No:      Patient Care Team:  Lenn Sink, MD as PCP - General    Medical History Review  Past Medical, Family, and Social History reviewed and  contribute to the patient presenting condition    Health Maintenance   Topic Date Due   . Hepatitis C screen  [birth year]   . HIV screen  08/21/1969   . DTaP/Tdap/Td vaccine (1 - Tdap) 08/21/1973   . Pneumococcal med risk (1 of 1 - PPSV23) 08/21/1973   . Shingles Vaccine (1 of 2 - 2 Dose Series) 08/21/2004   . Colon cancer screen colonoscopy  10/01/2014   . Breast cancer screen  03/06/2015   . Cervical cancer screen  02/19/2016   . Flu vaccine (1) 01/21/2017   . Lipid screen  02/18/2018

## 2017-04-05 ENCOUNTER — Encounter

## 2017-04-05 NOTE — Telephone Encounter (Signed)
Rx was denied because she should already have an Rx on paper for this month as well as for December.

## 2017-04-05 NOTE — Telephone Encounter (Signed)
Last visit:03/06/17    Next Visit Date:  Future Appointments  Date Time Provider Department Center   10/18/2017 1:20 PM Lenn SinkBradley P Everly, MD [id number]       Health Maintenance   Topic Date Due   . Hepatitis C screen  04/29/1956   . HIV screen  08/21/1969   . Pneumococcal med risk (1 of 1 - PPSV23) 08/21/1973   . Shingles Vaccine (1 of 2 - 2 Dose Series) 08/21/2004   . Colon cancer screen colonoscopy  10/01/2014   . Breast cancer screen  03/06/2015   . Cervical cancer screen  02/19/2016   . Lipid screen  02/18/2018   . DTaP/Tdap/Td vaccine (2 - Td) 03/07/2027   . Flu vaccine  Completed       No results found for: LABA1C          ( goal A1C is < 7)   No results found for: LABMICR  LDL Cholesterol (mg/dL)   Date Value   13/08/657809/29/2014 112   12/07/2010 124 (H)       (goal LDL is <100)   AST (U/L)   Date Value   02/18/2013 23     ALT (U/L)   Date Value   02/18/2013 9     BUN (mg/dL)   Date Value   46/96/295209/29/2014 15     BP Readings from Last 3 Encounters:   03/06/17 118/62   03/09/16 120/72   11/11/15 112/74          (goal 120/80)    All Future Testing planned in [facility]  Lab Frequency Next Occurrence   MAM DIGITAL SCREEN SELF REFERRAL W OR WO CAD BILATERAL Once 03/06/2017   Hepatitis C Antibody Once 03/06/2017   HIV Screen Once 03/06/2017   POCT Fecal Immunochemical Test (FIT) Once 03/27/2017   Lipid Panel Once 03/06/2017   Comprehensive Metabolic Panel Once 03/06/2017   CBC With Auto Differential Once 03/06/2017   TSH With Reflex Ft4 Once 03/06/2017   Vitamin D 25 Hydroxy Once 03/06/2017               Patient Active Problem List:     Osteopenia     Primary osteoarthritis of both hips

## 2017-04-11 NOTE — Telephone Encounter (Signed)
lvm to let her know that her insurance denied her HydrCodone medication and Dr. Patsey BertholdEverly did say she can cash pay for the medication or we can send her to pain management and they might be able to get it covered. She can also use a GoodRx card to help pay for the medication, if she does not have one we can mail one out to her.

## 2017-08-10 ENCOUNTER — Encounter

## 2017-08-10 NOTE — Telephone Encounter (Signed)
Last V 03/06/2017  Next V 10/18/2017

## 2017-08-11 ENCOUNTER — Encounter

## 2017-08-11 MED ORDER — HYDROCODONE-ACETAMINOPHEN 5-325 MG PO TABS
5-325 MG | ORAL_TABLET | Freq: Three times a day (TID) | ORAL | 0 refills | Status: DC | PRN
Start: 2017-08-11 — End: 2017-10-18

## 2017-08-11 MED ORDER — HYDROCODONE-ACETAMINOPHEN 5-325 MG PO TABS
5-325 MG | ORAL_TABLET | Freq: Three times a day (TID) | ORAL | 0 refills | Status: DC | PRN
Start: 2017-08-11 — End: 2017-08-11

## 2017-08-11 MED ORDER — DICLOFENAC SODIUM 75 MG PO TBEC
75 MG | ORAL_TABLET | ORAL | 0 refills | Status: AC
Start: 2017-08-11 — End: ?

## 2017-08-11 NOTE — Telephone Encounter (Signed)
Went to mail order on mistake please send in

## 2017-09-28 MED ORDER — PREDNISONE 20 MG PO TABS
20 MG | ORAL_TABLET | ORAL | 0 refills | Status: AC
Start: 2017-09-28 — End: 2017-10-08

## 2017-09-28 NOTE — Telephone Encounter (Signed)
Acute respiratory issue     Patient complains of  Chest congestion,ear pain, head congestion, eyes watery and puffy, hard to take a deep breath, productive cough,    Symptoms for how long  Started 2 days ago    What meds has pt tried  Sudafed,cough syrup      Does patient have asthma  No    Is patient on inhalers  No    Other symptoms include  No    Is this sinus, cold or cough related  Unsure    Please send to [facility] in [place]      *This condition is eligible for an eVisit. If not already active, sign patient up for MyChart to improve access and communication with the provider.*

## 2017-09-28 NOTE — Telephone Encounter (Signed)
Rx for steroid was submitted, if there is any worsening of symptoms she should let me know.  Diclofenac should be put on hold while on it.

## 2017-09-28 NOTE — Telephone Encounter (Signed)
Patient advised

## 2017-09-28 NOTE — Telephone Encounter (Signed)
Patient is requesting an antibiotic called in to the pharmacy,she has bad chest congestion. Please call patient with any questions or concerns.

## 2017-09-28 NOTE — Telephone Encounter (Signed)
.  LMOR for the patient to cb

## 2017-10-18 ENCOUNTER — Ambulatory Visit: Admit: 2017-10-18 | Discharge: 2017-10-18 | Payer: BLUE CROSS/BLUE SHIELD

## 2017-10-18 DIAGNOSIS — Z1211 Encounter for screening for malignant neoplasm of colon: Secondary | ICD-10-CM

## 2017-10-18 MED ORDER — DOXYCYCLINE HYCLATE 100 MG PO TABS
100 MG | ORAL_TABLET | Freq: Two times a day (BID) | ORAL | 0 refills | Status: AC
Start: 2017-10-18 — End: 2017-10-28

## 2017-10-18 MED ORDER — HYDROCODONE-ACETAMINOPHEN 5-325 MG PO TABS
5-325 MG | ORAL_TABLET | Freq: Three times a day (TID) | ORAL | 0 refills | Status: AC | PRN
Start: 2017-10-18 — End: 2018-01-15

## 2017-10-18 MED ORDER — ZOSTER VAC RECOMB ADJUVANTED 50 MCG/0.5ML IM SUSR
50 | Freq: Once | INTRAMUSCULAR | 1 refills | Status: AC
Start: 2017-10-18 — End: 2017-10-18

## 2017-10-18 MED ORDER — TRIAMCINOLONE ACETONIDE 55 MCG/ACT NA AERO
55 | Freq: Every day | NASAL | 3 refills | Status: AC
Start: 2017-10-18 — End: ?

## 2017-10-18 MED ORDER — HYDROCODONE-ACETAMINOPHEN 5-325 MG PO TABS
5-325 MG | ORAL_TABLET | Freq: Three times a day (TID) | ORAL | 0 refills | Status: AC | PRN
Start: 2017-10-18 — End: 2017-11-17

## 2017-10-18 MED ORDER — UMECLIDINIUM-VILANTEROL 62.5-25 MCG/INH IN AEPB
62.5-25 | Freq: Every day | RESPIRATORY_TRACT | 0 refills | Status: AC
Start: 2017-10-18 — End: ?

## 2017-10-18 MED ORDER — HYDROCODONE-ACETAMINOPHEN 5-325 MG PO TABS
5-325 MG | ORAL_TABLET | Freq: Three times a day (TID) | ORAL | 0 refills | Status: AC | PRN
Start: 2017-10-18 — End: 2017-12-17

## 2017-10-18 NOTE — Progress Notes (Signed)
Subjective:      Patient ID: Rachael Mason is a 62 y.o. female.  Visit Information    Have you changed or started any medications since your last visit including any over-the-counter medicines, vitamins, or herbal medicines? no   Are you having any side effects from any of your medications? -  no  Have you stopped taking any of your medications? Is so, why? -  no    Have you seen any other physician or provider since your last visit? No  Have you had any other diagnostic tests since your last visit? No  Have you been seen in the emergency room and/or had an admission to a hospital since we last saw you? No  Have you had your routine dental cleaning in the past 6 months? yes -     Have you activated your MyChart account? If not, what are your barriers? No:      Patient Care Team:  Lenn Sink, MD as PCP - General    Medical History Review  Past Medical, Family, and Social History reviewed and  contribute to the patient presenting condition    Health Maintenance   Topic Date Due   ??? Hepatitis C screen  10/03/1955   ??? HIV screen  08/21/1969   ??? Shingles Vaccine (1 of 2) 08/21/2004   ??? Colon cancer screen colonoscopy  10/01/2014   ??? Breast cancer screen  03/06/2015   ??? Cervical cancer screen  02/19/2016   ??? Pneumococcal 0-64 years Vaccine (1 of 1 - PPSV23) 05/01/2017   ??? Lipid screen  02/18/2018   ??? DTaP/Tdap/Td vaccine (2 - Td) 03/07/2027   ??? Flu vaccine  Completed       HPI    Review of Systems    Objective:   Physical Exam    Assessment / Plan:

## 2017-10-18 NOTE — Telephone Encounter (Signed)
Faxed cologuard

## 2017-10-18 NOTE — Progress Notes (Signed)
Rachael Mason is a 62 y.o. female who presents todayfor her medical conditions/complaints as noted below.  Rachael Mason is here today c/o6 Month Follow-Up; Sinusitis; and Congestion      HPI:      HPI    Here for follow up of chronic pain and has been having about 3 weeks of cough, congestion, headache, and sinus pressure with low grade fever.         Subjective:   Review of Systems   Constitutional: Negative for activity change, appetite change, chills, diaphoresis, fatigue, fever and unexpected weight change.   HENT: Positive for congestion, ear pain, postnasal drip, rhinorrhea, sinus pressure and sore throat. Negative for facial swelling, hearing loss and voice change.    Eyes: Negative for photophobia, pain, discharge, redness, itching and visual disturbance.   Respiratory: Positive for cough and shortness of breath. Negative for chest tightness and wheezing.    Cardiovascular: Negative for chest pain and palpitations.   Gastrointestinal: Negative for abdominal distention, abdominal pain, blood in stool, constipation, diarrhea, nausea and vomiting.   Endocrine: Negative for cold intolerance and heat intolerance.   Genitourinary: Negative for decreased urine volume, difficulty urinating, dysuria, flank pain, frequency, hematuria, pelvic pain, urgency, vaginal bleeding and vaginal discharge.   Musculoskeletal: Positive for back pain. Negative for arthralgias, gait problem, joint swelling, myalgias, neck pain and neck stiffness.   Skin: Negative for color change, pallor and rash.   Allergic/Immunologic: Negative for environmental allergies and food allergies.   Neurological: Negative for dizziness, tremors, seizures, syncope, facial asymmetry, speech difficulty, weakness, numbness and headaches.   Psychiatric/Behavioral: Negative for agitation, behavioral problems, dysphoric mood and sleep disturbance. The patient is not nervous/anxious and is not hyperactive.        Objective:    BP 100/64    Pulse 77     Ht  (1.651 m)    Wt 140 lb 3.2 oz (63.6 kg)    SpO2 97%    BMI 23.33 kg/m??     Physical Exam   Constitutional: She is oriented to person, place, and time. She appears well-developed and well-nourished. She does not appear ill. No distress.   HENT:   Head: Normocephalic and atraumatic.   Right Ear: External ear normal.   Left Ear: External ear normal.   Nose: Mucosal edema and rhinorrhea present.   Mouth/Throat: Mucous membranes are normal. Posterior oropharyngeal edema and posterior oropharyngeal erythema present. No oropharyngeal exudate.   Eyes: Pupils are equal, round, and reactive to light. EOM are normal. Right eye exhibits no discharge. Left eye exhibits no discharge. No scleral icterus.   Neck: Trachea normal and normal range of motion. Neck supple. No JVD present. Carotid bruit is not present. No tracheal deviation present. No thyromegaly present.   Cardiovascular: Normal rate, regular rhythm, S1 normal, S2 normal, normal heart sounds and normal pulses. Exam reveals no gallop, no S3, no S4, no distant heart sounds and no friction rub.   No murmur heard.  Pulmonary/Chest: No respiratory distress. She has no decreased breath sounds. She has no wheezes. She has rhonchi. She has no rales.   Abdominal: Soft. Normal appearance and bowel sounds are normal. There is no hepatosplenomegaly. There is no tenderness. There is no CVA tenderness. No hernia.   Lymphadenopathy:     She has no cervical adenopathy.        Right cervical: No superficial cervical adenopathy present.       Left cervical: No superficial cervical adenopathy present.  Neurological: She is alert and oriented to person, place, and time. She has normal strength. No cranial nerve deficit or sensory deficit. Coordination and gait normal.   Reflex Scores:       Brachioradialis reflexes are 2+ on the right side and 2+ on the left side.       Patellar reflexes are 2+ on the right side and 2+ on the left side.       Achilles reflexes are 2+ on the  right side and 2+ on the left side.  Skin: Skin is warm and dry. No lesion and no rash noted. She is not diaphoretic. No cyanosis. Nails show no clubbing.   Psychiatric: She has a normal mood and affect. Her speech is normal and behavior is normal. Judgment and thought content normal. Cognition and memory are normal.       Assessment & Plan:     1. Polyarthropathy  Has been doing well with the use of PRN norco, OARRS report reviewed.   - HYDROcodone-acetaminophen (NORCO) 5-325 MG per tablet; Take 1 tablet by mouth every 8 hours as needed for Pain for up to 30 days.  Dispense: 90 tablet; Refill: 0  - HYDROcodone-acetaminophen (NORCO) 5-325 MG per tablet; Take 1 tablet by mouth every 8 hours as needed for Pain for up to 30 days.  Dispense: 90 tablet; Refill: 0  - HYDROcodone-acetaminophen (NORCO) 5-325 MG per tablet; Take 1 tablet by mouth every 8 hours as needed for Pain for up to 30 days.  Dispense: 90 tablet; Refill: 0    2. Screen for colon cancer  - COLOGUARD (For external results only)    3. Screening for breast cancer  - MAM DIGITAL SCREEN W CAD BILATERAL; Future    Acute sinusitis, will start antibiotic and have her start on antibiotic.

## 2017-10-26 ENCOUNTER — Encounter: Admit: 2017-10-26 | Discharge: 2017-10-26 | Payer: BLUE CROSS/BLUE SHIELD

## 2017-10-26 DIAGNOSIS — Z23 Encounter for immunization: Secondary | ICD-10-CM

## 2017-10-26 NOTE — Progress Notes (Signed)
Patient was here in the office per DR.EVERLY orders, received in left deltoid and tolerated well. Patient advised to stay in office 5-10 minutes after vaccine was given and to report any adverse reactions immediately.

## 2018-02-12 ENCOUNTER — Encounter

## 2018-02-12 NOTE — Telephone Encounter (Signed)
Last visit: 10/18/17  Oars please review today     Next Visit Date:  Future Appointments   Date Time Provider Department Center   02/21/2018  1:30 PM Linda HedgesBeth Schott, APRN - [facility]       Health Maintenance   Topic Date Due   ??? Hepatitis C screen  1955/08/02   ??? HIV screen  08/21/1969   ??? Shingles Vaccine (1 of 2) 08/21/2004   ??? Breast cancer screen  03/06/2015   ??? Cervical cancer screen  02/19/2016   ??? Pneumococcal 0-64 years Vaccine (1 of 1 - PPSV23) 05/01/2017   ??? Flu vaccine (1) 01/21/2018   ??? Lipid screen  02/18/2018   ??? Colon cancer screen fecal DNA test (Cologuard)  11/29/2020   ??? DTaP/Tdap/Td vaccine (2 - Td) 03/07/2027       No results found for: LABA1C          ( goal A1C is < 7)   No results found for: LABMICR  LDL Cholesterol (mg/dL)   Date Value   13/08/657809/29/2014 112   12/07/2010 124 (H)       (goal LDL is <100)   AST (U/L)   Date Value   02/18/2013 23     ALT (U/L)   Date Value   02/18/2013 9     BUN (mg/dL)   Date Value   46/96/295209/29/2014 15     BP Readings from Last 3 Encounters:   10/18/17 100/64   03/06/17 118/62   03/09/16 120/72          (goal 120/80)    All Future Testing planned in [facility]  Lab Frequency Next Occurrence   DEXA BONE DENSITY 2 SITES Once 10/19/2017   MAM DIGITAL SCREEN SELF REFERRAL W OR WO CAD BILATERAL Once 03/06/2017   Hepatitis C Antibody Once 03/06/2017   HIV Screen Once 03/06/2017   POCT Fecal Immunochemical Test (FIT) Once 03/27/2017   Lipid Panel Once 03/06/2017   Comprehensive Metabolic Panel Once 03/06/2017   CBC With Auto Differential Once 03/06/2017   TSH With Reflex Ft4 Once 03/06/2017   Vitamin D 25 Hydroxy Once 03/06/2017   MAM DIGITAL SCREEN W CAD BILATERAL Once 11/17/2017               Patient Active Problem List:     Osteopenia     Primary osteoarthritis of both hips

## 2018-02-16 NOTE — Telephone Encounter (Signed)
LOV 10/18/17 - must be seen    No medication agreement on file.    Referral to pain management placed.    Patient was just started on Norco 10/18/17 and a small supply was provided at that time. I feel it is appropriate for her to see pain management prior to any refills of Norco.    Controlled substances monitoring: no signs of potential drug abuse or diversion identified and OARRS report reviewed today- patient referred to pain management.

## 2018-02-20 ENCOUNTER — Encounter

## 2018-02-21 ENCOUNTER — Ambulatory Visit: Admit: 2018-02-21 | Discharge: 2018-02-21 | Payer: BLUE CROSS/BLUE SHIELD | Attending: Registered Nurse

## 2018-02-21 DIAGNOSIS — M25551 Pain in right hip: Secondary | ICD-10-CM

## 2018-02-21 DIAGNOSIS — Z5181 Encounter for therapeutic drug level monitoring: Secondary | ICD-10-CM

## 2018-02-21 DIAGNOSIS — M25552 Pain in left hip: Secondary | ICD-10-CM

## 2018-02-21 LAB — POCT RAPID DRUG SCREEN URINE
Amphetamine Screen, Ur: NEGATIVE
Barbiturate Screen, Ur: NEGATIVE
Benzodiazepine Screen, Urine: NEGATIVE
Buprenorphine Urine: NEGATIVE
Cocaine Metabolite Screen, Urine: NEGATIVE
Gabapentin Screen, Urine: NEGATIVE
MDMA, Urine: NEGATIVE
Methadone Screen, Urine: NEGATIVE
Methamphetamine, Urine: NEGATIVE
Opiate Screen, Urine: NEGATIVE
Oxycodone Screen, Ur: NEGATIVE
Phencyclidine (PCP), Screen, Urine: NEGATIVE
Propoxyphene Screen, Urine: NEGATIVE
THC Screen, Urine: NEGATIVE
Tricyclic Antidepressants, Urine: NEGATIVE

## 2018-02-21 NOTE — Progress Notes (Signed)
Karena AddisonBeth A. Sebastin Perlmutter, FNP-C  [facility]  [phone]  [place], [place]  [id number]  [phone]  [phone]    Rachael Mason is a 62 y.o. female who is here with c/o of:    Chief Complaint: Discuss Medications (Dr. Patsey BertholdEverly patient )      Patient Accompanied by: husband    HPI - Rachael Mason is here today with c/o:    Patient has several year hx of bilateral hip pain. Patient reports this is an ongoing problem for a few years, but is increasing. She reports she has been taking voltaren and Norco for her pain and this is controlling her pain. Patient reports taking norco only as needed, but reports she is taking it daily.      Patient Active Problem List:     Osteopenia     Primary osteoarthritis of both hips     No past medical history on file.   No past surgical history on file.  No family history on file.  Social History     Tobacco Use   ??? Smoking status: Current Every Day Smoker     Packs/day: 0.50     Years: 38.00     Pack years: 19.00     Types: Cigarettes   ??? Smokeless tobacco: Never Used   Substance Use Topics   ??? Alcohol use: Yes     Alcohol/week: 2.0 standard drinks     Types: 2 Standard drinks or equivalent per week     ALLERGIES:  No Known Allergies       Subjective     ?? Constitutional:  Negative for activity change, appetite change,unexpected weight change, chills, fever, and fatigue.    ?? HENT: Negative for ear pain, sore throat,  Rhinorrhea, sinus pain, sinus pressure, congestion.    ?? Eyes:  Negative for pain and discharge.   ?? Respiratory:  Negative for chest tightness, shortness of breath, wheezing, and cough.    ?? Cardiovascular:  Negative for chest pain, palpitations and leg swelling.   ?? Gastrointestinal: Negative for abdominal pain, blood in stool, constipation,diarrhea, nausea and vomiting.   ?? Endocrine: Negative for cold intolerance, heat intolerance, polydipsia, polyphagia and polyuria.   ?? Genitourinary: Negative for difficulty urinating, dysuria, flank pain,  frequency, hematuria and urgency.   ?? Musculoskeletal: Negative for arthralgias, back pain, joint swelling, myalgias, neck pain and neck stiffness. Positive for bilateral hip pain  ?? Skin: Negative for rash and wound.   ?? Allergic/Immunologic: Negative for environmental allergies and food allergies.   ?? Neurological:  Negative for dizziness, light-headedness, numbness and headaches.   ?? Hematological:  Negative for adenopathy. Does not bruise/bleed easily.   ?? Psychiatric/Behavioral: Negative for self-injury, sleep disturbance and suicidal ideas.     Objective     PHYSICAL EXAM:     ?? Constitutional: Rachael Mason is oriented to person, place, and time. Vital signs are normal. Appears well-developed and well-nourished.   ?? HEENT:   ?? Head: Normocephalic and atraumatic.   Right Ear: Hearing and external ear normal.     ?? Left Ear: Hearing and external ear normal.    ?? Nose: Nares normal.   ?? Eyes:PERRL, EOMI, Conjunctiva normal, No discharge.    ?? Neck: Full passive range of motion. Non-tender on palpation. Neck supple. No thyromegaly present. Trachea normal.  ?? Cardiovascular: Normal rate, regular rhythm, S1, S2, no murmur, no gallop, no friction rub, intact distal pulses.    ?? Pulmonary/Chest:  Breath sounds are clear throughout, No respiratory distress, No wheezing, No chest tenderness. Effort normal  ?? Abdominal: Soft. Normal appearance, bowel sounds are present and normoactive. There is no hepatosplenomegaly. There is no tenderness. There is no CVA tenderness.   ?? Musculoskeletal: Extremities appear regular and symmetric. No evident masses, lesions, foreign bodies, or other abnormalities. No edema. No tenderness on palpation. Joints are stable. Full ROM, strength and tone are within normal limits. Hips: non-tender, FULL ROM, no crepitus, no swelling   ?? Neurological: Alert and oriented to person, place, and time. Normal motor function, Normal sensory function, No focal deficits noted.   He has normal strength.  ?? Skin:  Skin is warm, dry and intact. No obvious lesions on exposed skin  ?? Psychiatric: Normal mood and affect. Speech is normal and behavior is normal.     Nursing note and vitals reviewed.  Blood pressure 128/82, pulse 70, weight 148 lb 9.6 oz (67.4 kg), SpO2 93 %.  Body mass index is 24.73 kg/m??.    Wt Readings from Last 3 Encounters:   02/21/18 148 lb 9.6 oz (67.4 kg)   10/18/17 140 lb 3.2 oz (63.6 kg)   03/06/17 138 lb (62.6 kg)     BP Readings from Last 3 Encounters:   02/21/18 128/82   10/18/17 100/64   03/06/17 118/62       Results for POC orders placed in visit on 02/21/18   POCT Rapid Drug Screen   Result Value Ref Range    Amphetamine Screen, Urine neg     Barbiturate Screen, Urine neg     Benzodiazepine Screen, Urine neg     Buprenorphine Urine neg     Cocaine Metabolite Screen, Urine neg     Gabapentin Screen, Urine neg     MDMA, Urine neg     Methamphetamine, Urine neg     Methadone Screen, Urine neg     Opiate Scrn, Ur neg     Oxycodone Screen, Ur neg     PCP Screen, Urine neg     Propoxyphene Screen, Urine neg     THC Screen, Urine neg     Tricyclic Antidepressants, Urine neg        Completed Orders/Prescriptions   No orders of the defined types were placed in this encounter.              AssessmentPlan/Medical Decision Making     1. Chronic hip pain, bilateral  - offered PT - patient declined  - offered pain management - patient declined  - OTC ibuprofen/tylenol for pain  - patient has been informed that she will not be receiving any further narcotic pain medication for this problem    2. Medication monitoring encounter  -   Controlled Substance Monitoring:    Acute and Chronic Pain Monitoring:   RX Monitoring 02/21/2018   Attestation -   Acute Pain Prescriptions -   Periodic Controlled Substance Monitoring Possible medication side effects, risk of tolerance/dependence & alternative treatments discussed.;No signs of potential drug abuse or diversion identified.;Obtaining appropriate analgesic effect of  treatment.   Chronic Pain > 50 MEDD Re-evaluated the status of the patient's underlying condition causing pain.;Considered consultation with a specialist.;Obtained or confirmed "Consent for Opioid Use" on file.     - POCT Rapid Drug Screen  - Drug Screen, Pain; Future    3. Personal history of tobacco use, presenting hazards to health  - Tobacco Cessation Counseling: Patient advised about behavior change, including information about  personal health harms, usage of appropriate cessation measures and benefits of cessation.  Time spent (minutes): 5  - PR TOBACCO USE CESSATION INTERMEDIATE 3-10 MINUTES [99406]      No follow-ups on file.     1.  Rachael Mason received counseling on the following healthy behaviors: nutrition, exercise, medication adherence and tobacco cessation  2.  Patient given educational materials - see patient instructions  3.  Was a self-tracking handout given in paper form or via MyChart? No  If yes, see orders or list here.  4.  Discussed use, benefit, and side effects of prescribed medications.  Barriers to medication compliance addressed.  All patient questions answered.  Pt voiced understanding.   5.  Reviewed prior labs, imaging, consultation, follow up, and health maintenance  6.  Continue current medications, diet and exercise.  7. Discussed use, benefit, and side effects of prescribed medications.  Barriers to medication compliance addressed.  All her questions were answered.  Pt voiced understanding. Rachael Mason will continue current medications, diet and exercise.    Patient given educational materials on smoking cessation    Of the 25 minute duration appointment visit, Karena Addison, CNP spent at least 50% of the face-to-face time in counseling, explanation of diagnosis, planning of further management, and answering all questions.    Signed:  Karena Addison, CNP    This note is created with the assistance of a speech-recognition program.  While intending to generate a document that actually reflects  the content of the visit, no guarantees can be provided that every mistake has been identified and corrected by editing.

## 2018-02-22 ENCOUNTER — Inpatient Hospital Stay: Payer: BLUE CROSS/BLUE SHIELD

## 2018-02-22 NOTE — Patient Instructions (Signed)
Stopping Smoking: Care Instructions  Your Care Instructions  Cigarette smokers crave the nicotine in cigarettes. Giving it up is much harder than simply changing a habit. Your body has to stop craving the nicotine. It is hard to quit, but you can do it. There are many tools that people use to quit smoking. You may find that combining tools works best for you.  There are several steps to quitting. First you get ready to quit. Then you get support to help you. After that, you learn new skills and behaviors to become a nonsmoker. For many people, a necessary step is getting and using medicine.  Your doctor will help you set up the plan that best meets your needs. You may want to attend a smoking cessation program to help you quit smoking. When you choose a program, look for one that has proven success. Ask your doctor for ideas. You will greatly increase your chances of success if you take medicine as well as get counseling or join a cessation program.  Some of the changes you feel when you first quit tobacco are uncomfortable. Your body will miss the nicotine at first, and you may feel short-tempered and grumpy. You may have trouble sleeping or concentrating. Medicine can help you deal with these symptoms. You may struggle with changing your smoking habits and rituals. The last step is the tricky one: Be prepared for the smoking urge to continue for a time. This is a lot to deal with, but keep at it. You will feel better.  Follow-up care is a key part of your treatment and safety. Be sure to make and go to all appointments, and call your doctor if you are having problems. It's also a good idea to know your test results and keep a list of the medicines you take.  How can you care for yourself at home?  ?? Ask your family, friends, and coworkers for support. You have a better chance of quitting if you have help and support.  ?? Join a support group, such as Nicotine Anonymous, for people who are trying to quit  smoking.  ?? Consider signing up for a smoking cessation program, such as the American Lung Association's Freedom from Smoking program.  ?? Get text messaging support. Go to the website at www.smokefree.gov to sign up for the SmokefreeTXT program.  ?? Set a quit date. Pick your date carefully so that it is not right in the middle of a big deadline or stressful time. Once you quit, do not even take a puff. Get rid of all ashtrays and lighters after your last cigarette. Clean your house and your clothes so that they do not smell of smoke.  ?? Learn how to be a nonsmoker. Think about ways you can avoid those things that make you reach for a cigarette.  ? Avoid situations that put you at greatest risk for smoking. For some people, it is hard to have a drink with friends without smoking. For others, they might skip a coffee break with coworkers who smoke.  ? Change your daily routine. Take a different route to work or eat a meal in a different place.  ?? Cut down on stress. Calm yourself or release tension by doing an activity you enjoy, such as reading a book, taking a hot bath, or gardening.  ?? Talk to your doctor or pharmacist about nicotine replacement therapy, which replaces the nicotine in your body. You still get nicotine but you do not use tobacco.   Nicotine replacement products help you slowly reduce the amount of nicotine you need. These products come in several forms, many of them available over-the-counter:  ? Nicotine patches  ? Nicotine gum and lozenges  ? Nicotine inhaler  ?? Ask your doctor about bupropion (Wellbutrin) or varenicline (Chantix), which are prescription medicines. They do not contain nicotine. They help you by reducing withdrawal symptoms, such as stress and anxiety.  ?? Some people find hypnosis, acupuncture, and massage helpful for ending the smoking habit.  ?? Eat a healthy diet and get regular exercise. Having healthy habits will help your body move past its craving for nicotine.  ?? Be prepared  to keep trying. Most people are not successful the first few times they try to quit. Do not get mad at yourself if you smoke again. Make a list of things you learned and think about when you want to try again, such as next week, next month, or next year.  Where can you learn more?  Go to [email or web address] and sign in to your MyChart account. Enter Y522 in the Search Health Information box to learn more about "Stopping Smoking: Care Instructions."     If you do not have an account, please click on the "Sign Up Now" link.  Current as of: February 15, 2017  Content Version: 12.1  ?? 2006-2019 [employer]. Care instructions adapted under license by [facility]. If you have questions about a medical condition or this instruction, always ask your healthcare professional. [employer] disclaims any warranty or liability for your use of this information.           Learning About Benefits From Quitting Smoking  How does quitting smoking make you healthier?    If you're thinking about quitting smoking, you may have a few reasons to be smoke-free. Your health may be one of them.  ?? When you quit smoking, you lower your risks for cancer, lung disease, heart attack, stroke, blood vessel disease, and blindness from macular degeneration.  ?? When you're smoke-free, you get sick less often, and you heal faster. You are less likely to get colds, flu, bronchitis, and pneumonia.  ?? As a nonsmoker, you may find that your mood is better and you are less stressed.  When and how will you feel healthier?  Quitting has real health benefits that start from day 1 of being smoke-free. And the longer you stay smoke-free, the healthier you get and the better you feel.  The first hours  ?? After just 20 minutes, your blood pressure and heart rate go down. That means there's less stress on your heart and blood vessels.  ?? Within 12 hours, the level of carbon monoxide in your blood drops back to normal.  That makes room for more oxygen. With more oxygen in your body, you may notice that you have more energy than when you smoked.  After 2 weeks  ?? Your lungs start to work better.  ?? Your risk of heart attack starts to drop.  After 1 month  ?? When your lungs are clear, you cough less and breathe deeper, so it's easier to be active.  ?? Your sense of taste and smell return. That means you can enjoy food more than you have since you started smoking.  Over the years  ?? After 1 year, your risk of heart disease is half what it would be if you kept smoking.  ?? After 5 years, your risk of stroke starts to shrink. Within a   few years after that, it's about the same as if you'd never smoked.  ?? After 10 years, your risk of dying from lung cancer is cut by about half. And your risk for many other types of cancer is lower too.  How would quitting help others in your life?  When you quit smoking, you improve the health of everyone who now breathes in your smoke.  ?? Their heart, lung, and cancer risks drop, much like yours.  ?? They are sick less. For babies and small children, living smoke-free means they're less likely to have ear infections, pneumonia, and bronchitis.  ?? If you're a woman who is or will be pregnant someday, quitting smoking means a healthier newborn.  ?? Children who are close to you are less likely to become adult smokers.  Where can you learn more?  Go to [email or web address] and sign in to your MyChart account. Enter O319 in the Search Health Information box to learn more about "Learning About Benefits From Quitting Smoking."     If you do not have an account, please click on the "Sign Up Now" link.  Current as of: February 15, 2017  Content Version: 12.1  ?? 2006-2019 [employer]. Care instructions adapted under license by [facility]. If you have questions about a medical condition or this instruction, always ask your healthcare professional. [employer] disclaims  any warranty or liability for your use of this information.

## 2018-02-26 LAB — DRUG SCREEN, PAIN
6-Acetylmorphine, Ur: NOT DETECTED
7-Aminoclonazepam, Urine: NOT DETECTED
Alpha-OH-Alpraz, Urine: NOT DETECTED
Alprazolam, Urine: NOT DETECTED
Amphetamine, Urine: NOT DETECTED
Barbiturates, Urine: NOT DETECTED
Benzoylecgonine, Ur: NOT DETECTED
Buprenorphine Urine: NOT DETECTED
Carisoprodol, Ur: NOT DETECTED
Clonazepam, Urine: NOT DETECTED
Codeine, Urine: NOT DETECTED
Creatinine, Ur: 41.3 mg/dL (ref 20.0–400.0)
Diazepam, Urine: NOT DETECTED
Ethyl Glucuronide Ur: NOT DETECTED
Fentanyl, Ur: NOT DETECTED
Hydromorphone, Urine: NOT DETECTED
Lorazepam, Urine: NOT DETECTED
MDA, Urine: NOT DETECTED
MDEA, EVE, Ur: NOT DETECTED
MDMA, Urine: NOT DETECTED
Marijuana Metab, Ur: NOT DETECTED
Meperidine Metab, Ur: NOT DETECTED
Methadone, Urine: NOT DETECTED
Methamphetamine, Urine: NOT DETECTED
Methylphenidate: NOT DETECTED
Midazolam, Urine: NOT DETECTED
Morphine Urine: NOT DETECTED
Norbuprenorphine, Urine: NOT DETECTED
Nordiazepam, Urine: NOT DETECTED
Norfentanyl, Urine: NOT DETECTED
Noroxycodone, Urine: NOT DETECTED
Noroxymorphone, Urine: NOT DETECTED
Oxazepam, Urine: NOT DETECTED
Oxycodone Urine: NOT DETECTED
Oxymorphone, Urine: NOT DETECTED
Phencyclidine, Urine: NOT DETECTED
Phentermine, Ur: NOT DETECTED
Propoxyphene, Urine: NOT DETECTED
Tapentadol, Urine: NOT DETECTED
Tapentadol-O-Sulfate, Urine: NOT DETECTED
Temazepam, Urine: NOT DETECTED
Tramadol, Urine: NOT DETECTED
Zolpidem, Urine: NOT DETECTED

## 2019-04-10 IMAGING — DX RIGHT HIP INJECTION
1 series · 1 of 1 positions shown · IV contrast (isovue)
Comparison: none

RIGHT HIP INJECTION, 04/10/2019 [DATE]: 
CLINICAL INDICATION:  Right hip pain. 
TECHNIQUE AND FINDINGS: The indications, alternatives and indications of the 
procedure explained to the patient. Consent was obtained. 
The skin over the RIGHT hip was sterilely prepped and draped.  Skin anesthesia 
was obtained using cryogenic spray.   Under fluoroscopic guidance a 22-gauge 
needle was advanced into the hip joint. 1 cc of Isovue was injected to confirm 
the intra-articular position of the needle tip. A total amount of 2 cc's 
iodinated contrast was discarded.  The hip was then injected with 3 milligrams 
of Depo-Medrol and a cc of Marcaine. 
The patient tolerated the procedure well and left the department in stable 
condition.  The fluoroscopy time for this procedure was 0.8 minutes and a total 
of one image(s) obtained.

[AP]
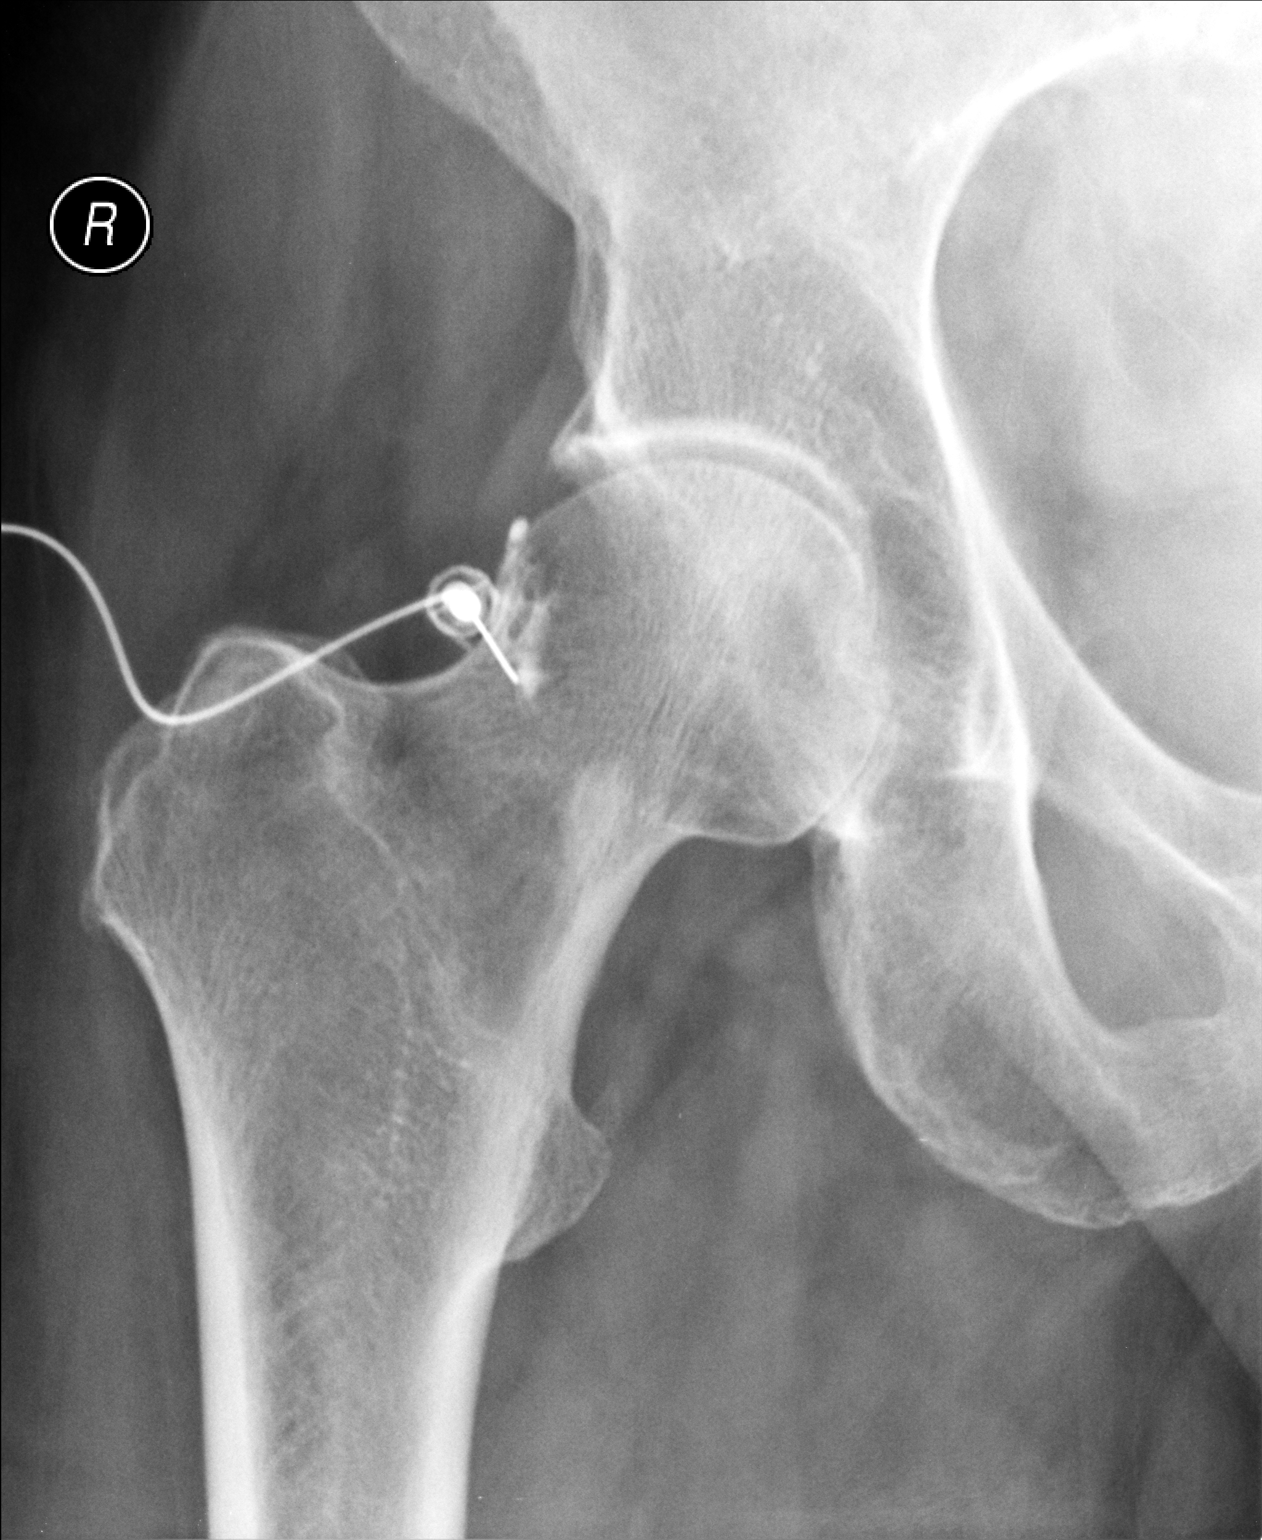

[1 of 1 positions shown; findings below may reference images not displayed]

IMPRESSION: Technically successful fluoroscopically guided hip injection.

## 2019-04-10 IMAGING — DX LEFT HIP INJECTION
1 series · 1 of 1 positions shown · IV contrast (isovue)
Comparison: none

LEFT HIP INJECTION, 04/10/2019 [DATE]: 
CLINICAL INDICATION:  Left hip pain. 
TECHNIQUE AND FINDINGS: The indications, alternatives and indications of the 
procedure explained to the patient. Consent was obtained. 
The skin over the LEFT hip was sterilely prepped and draped.  Skin anesthesia 
was obtained using cryogenic spray.   Under fluoroscopic guidance a 22-gauge 
needle was advanced into the hip joint. 1 cc of Isovue was injected to confirm 
the intra-articular position of the needle tip. A total amount of 2 cc's 
iodinated contrast was discarded.  The hip was then injected with 3 milligrams 
of Depo-Medrol and 80 cc of Marcaine. 
The patient tolerated the procedure well and left the department in stable 
condition.  The fluoroscopy time for this procedure was 0.8 minutes and a total 
of one image(s) obtained.

[AP]
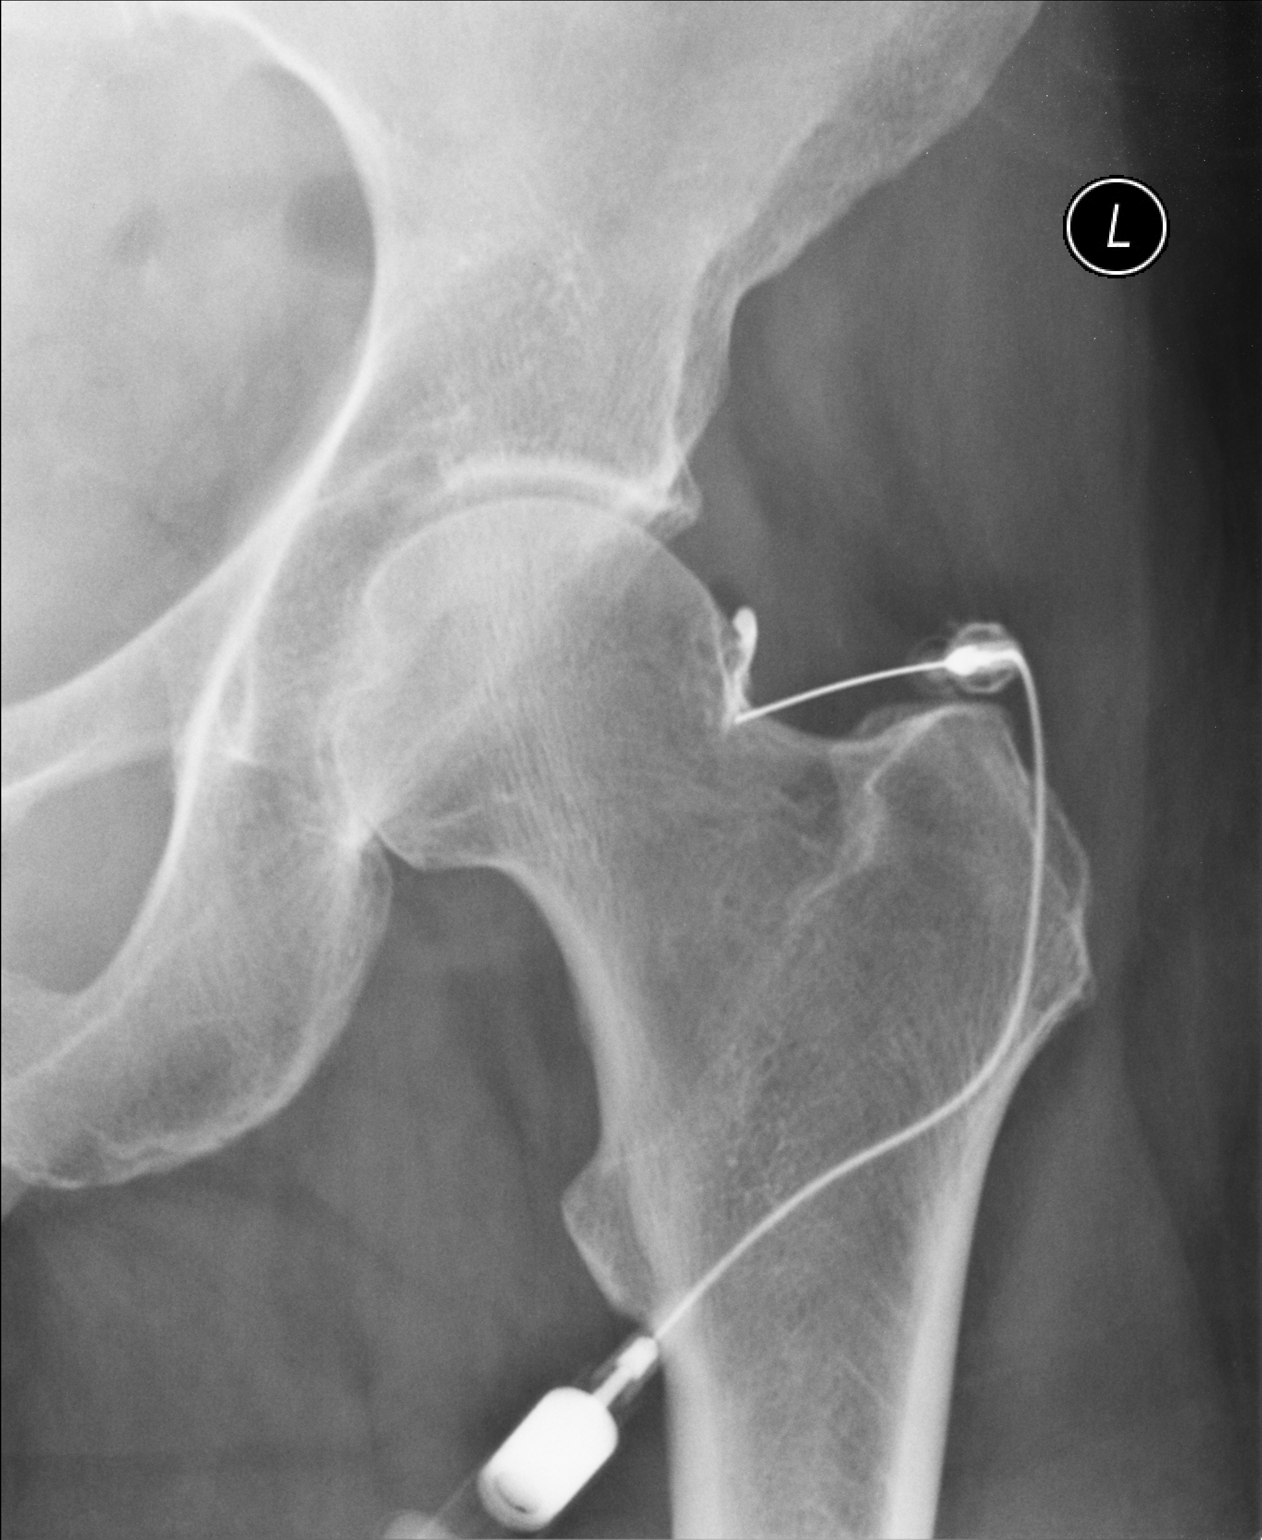

[1 of 1 positions shown; findings below may reference images not displayed]

IMPRESSION: Technically successful fluoroscopically guided hip injection.

## 2020-04-15 IMAGING — MR MRI LEFT SHOULDER WITHOUT CONTRAST
7 of 8 series · 31 of 40 positions shown · IV contrast (gadolinium)
Comparison: None

MRI LEFT SHOULDER WITHOUT CONTRAST, 04/15/2020 [DATE]: 
CLINICAL INDICATION: Blunt trauma injury to left shoulder 3 months ago. Left 
anterior shoulder pain.
TECHNIQUE: Multiplanar, multiecho position MR images of the shoulder were 
performed without intravenous gadolinium enhancement.

[Series 101: survey_fullfov_transversal · axial · 10.0mm · 1.04mm/px · z∈[-51,+51]mm · 2 of 7 slices shown]
[im 1/7]
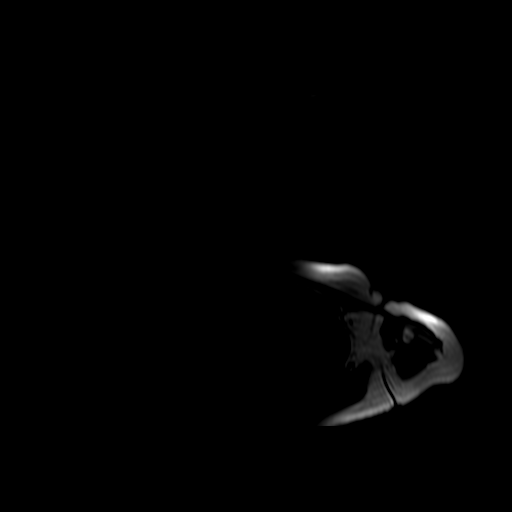
[im 7/7]
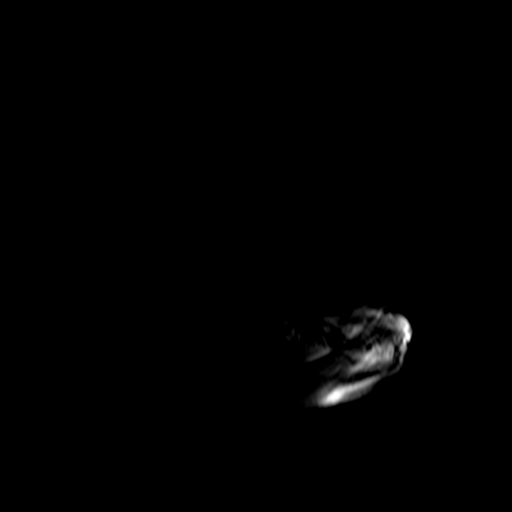

[Series 201: survey 3 plane · axial · 10.0mm · 1.56mm/px · z∈[-20,+199]mm · 2 of 9 slices shown]
[im 1/9]
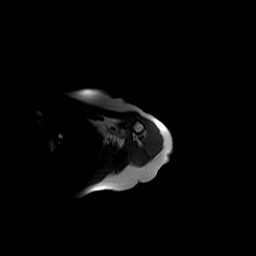
[im 9/9]
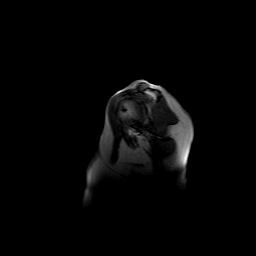

[Series 401: pdw spair ax · axial · 3.0mm · 0.56mm/px · z∈[-34,+49]mm · 7 of 27 slices shown]
[im 1/27]
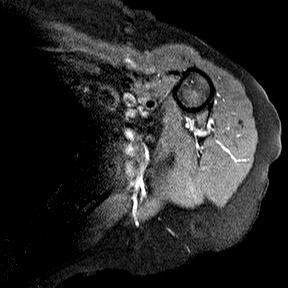
[im 5/27]
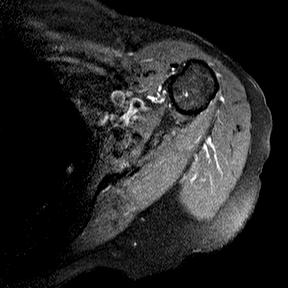
[im 9/27]
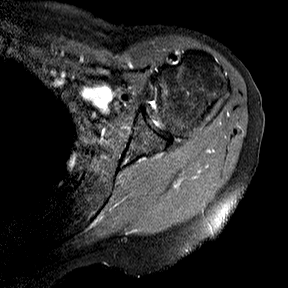
[im 14/27]
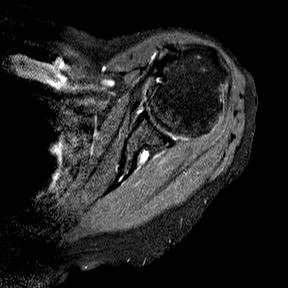
[im 18/27]
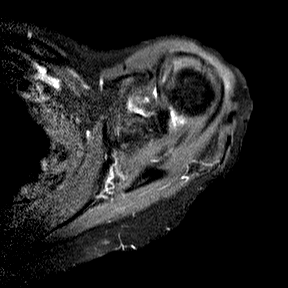
[im 22/27]
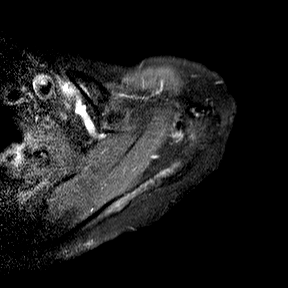
[im 27/27]
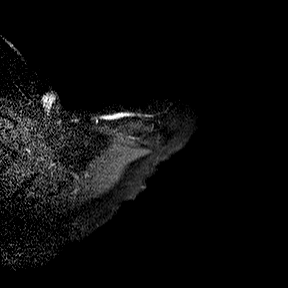

[Series 501: t2w spair sag · oblique · 4.0mm · 0.56mm/px · 6 of 22 slices shown]
[im 1/22]
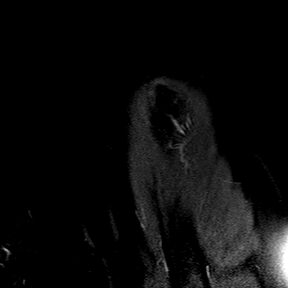
[im 5/22]
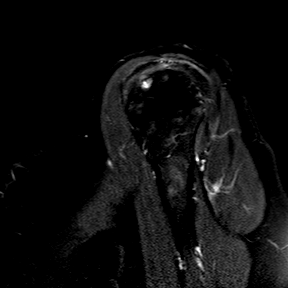
[im 9/22]
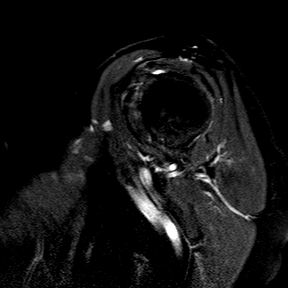
[im 13/22]
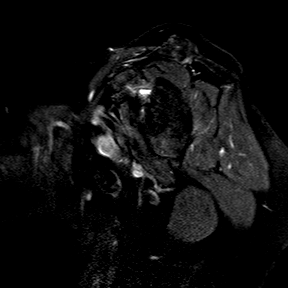
[im 17/22]
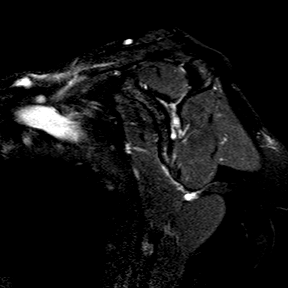
[im 22/22]
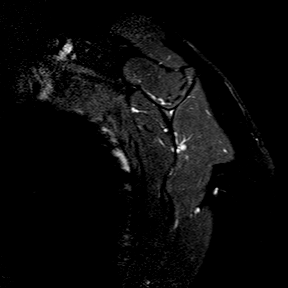

[Series 601: t2w tse cor · oblique · 3.0mm · 0.31mm/px · 6 of 20 slices shown]
[im 1/20]
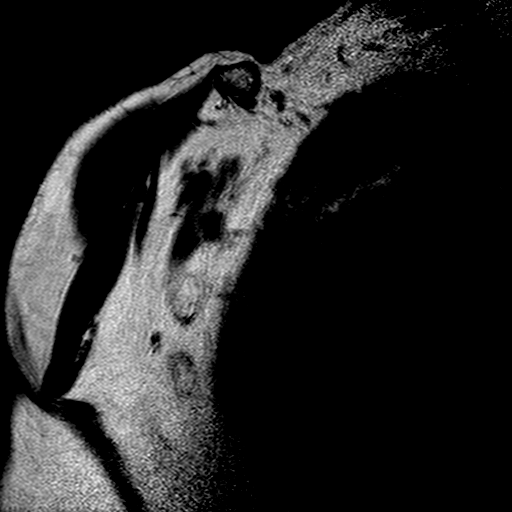
[im 4/20]
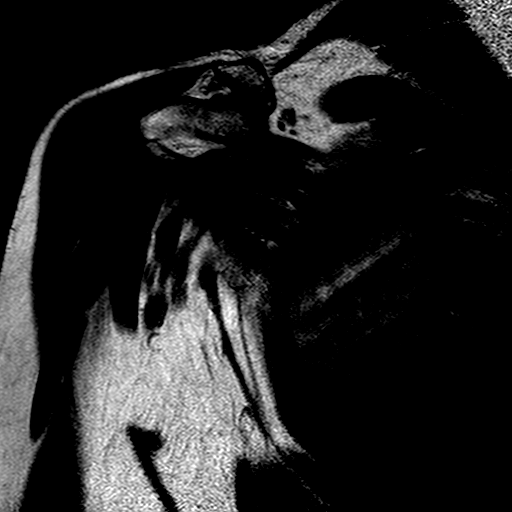
[im 8/20]
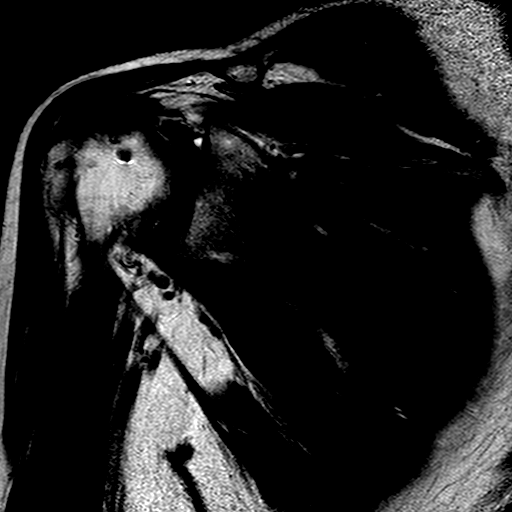
[im 12/20]
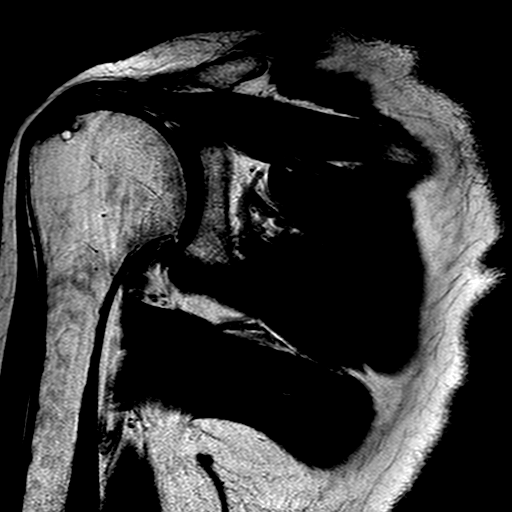
[im 16/20]
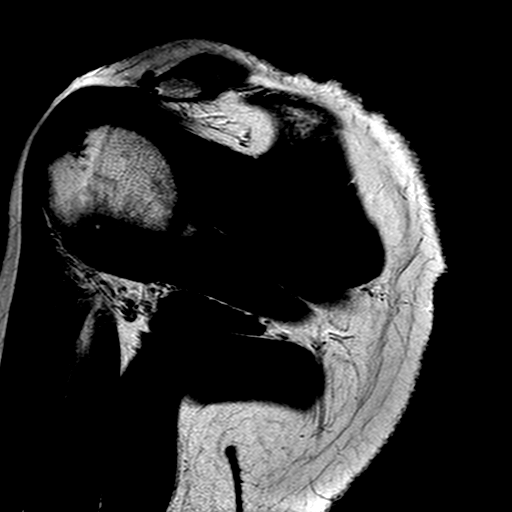
[im 20/20]
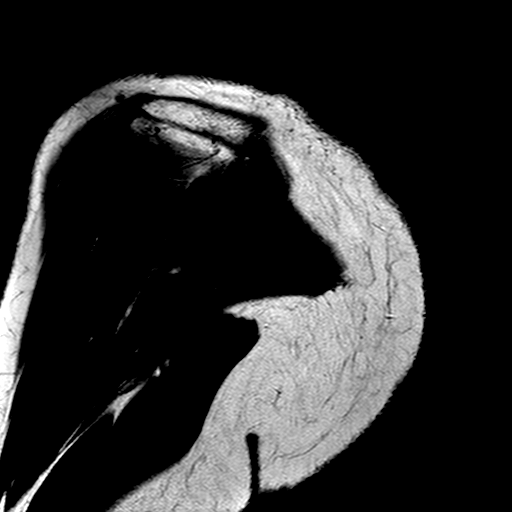

[Series 701: t2w spair cor · oblique · 3.0mm · 0.50mm/px · 6 of 20 slices shown]
[im 1/20]
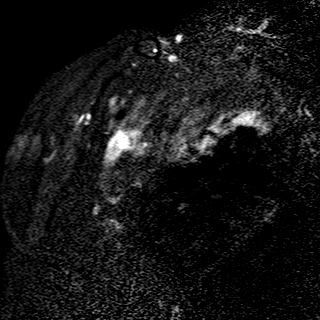
[im 4/20]
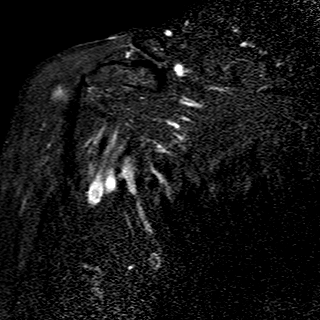
[im 8/20]
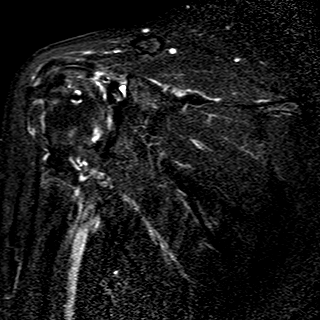
[im 12/20]
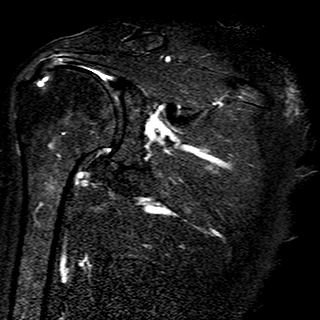
[im 16/20]
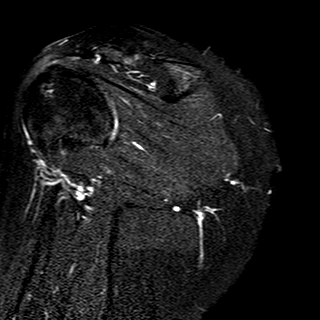
[im 20/20]
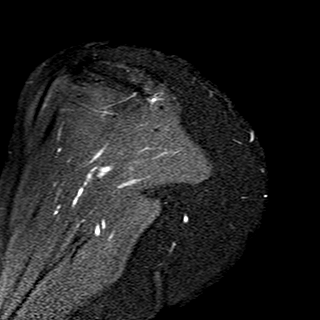

[Series 801: t1w tse sag · oblique · 4.0mm · 0.31mm/px · 2 of 22 slices shown]
[im 1/22]
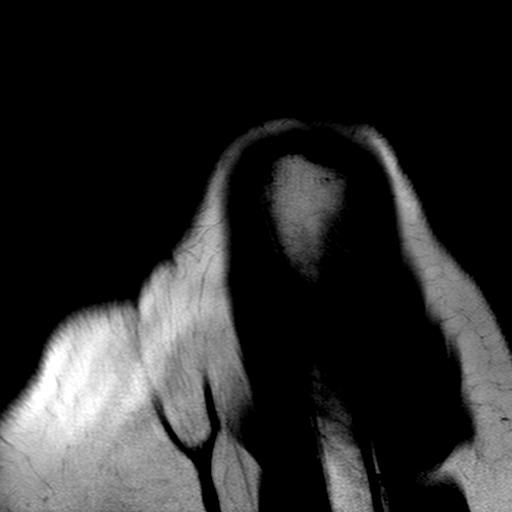
[im 5/22]
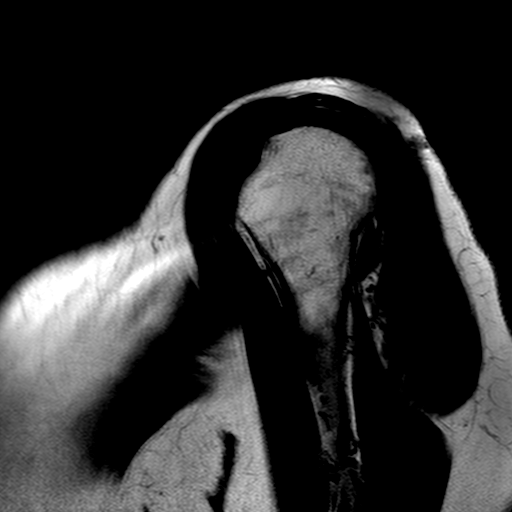

[31 of 40 positions shown; findings below may reference images not displayed]

FINDINGS: ROTATOR CUFF: There is focal high-grade partial articular surface tearing of the 
supraspinatus approximately 2 cm proximal from its insertion. This is seen for 
example on the coronal series, image 9. High-grade partial tearing measures 
approximately 3 to 4 mm in AP dimension. No discrete full-thickness component. 
The subscapularis, infraspinatus and teres minor tendons are preserved. The 
rotator cuff musculature is symmetric without mass, signal abnormality or 
atrophy. 
ACROMIOCLAVICULAR JOINT: Posterior changes are suggested with metallic 
susceptibility artifact about the peripheral margin of the acromion. The 
undersurface of the acromion is flat. The coracoacromial ligament is intact 
without prominent spurring at the acromial attachment. The acromioclavicular and 
coracoclavicular ligaments are preserved. No significant fluid within the 
subacromial-subdeltoid space. 
GLENOHUMERAL JOINT: Humeral head is well located within the glenoid fossa. Mild 
articular cartilaginous loss inferiorly. There is metallic susceptibility 
artifact about the anterior margin of the humeral head suggest for prior 
surgical intervention. The glenoid labrum is preserved. No paralabral cyst. No 
significant joint effusion. There is abnormal intrasubstance signal intensity 
and thickening of the intra-articular portion of the long head of the biceps 
tendon. 
BONES AND SOFT TISSUES: Bone marrow signal intensity is negative for fracture. 
No Hill-Sachs defect. There is cystic change involving the peripheral margin of 
the humeral head. The axillary region is negative. Subcutaneous tissues are 
negative.
IMPRESSION: 1.  Focal high-grade articular surface tearing of the supraspinatus tendon 
proximal to its insertion. 
2.  Tendinopathy of the proximal long head of the biceps tendon. 
3.  Mild degenerative changes of the glenohumeral articulation. 
4.  Postsurgical changes with metallic susceptibility artifact with prior 
acromioplasty suggested.

## 2023-04-13 IMAGING — CT CT LUNG SCREENING
2 of 4 series · 6 of 36 positions shown, 7 images · non-contrast
Comparison: None   
Count of known CT and Cardiac Nuclear Medicine studies performed in the previous 
12 months = 0.

________________________________________________________________________________________________ 
CT LUNG SCREENING, 04/13/2023 [DATE]: 
CLINICAL INDICATION: Personal History Of Nicotine Dependence 
A search for DICOM formatted images was conducted for prior CT imaging studies 
completed at a non-affiliated media free facility.
TECHNIQUE: The chest was scanned from base of neck through the lung bases 
without contrast on a high resolution low dose CT scanner. Routine MPR and MIP 
reconstruction images were performed.

[Series 4: coronal · coronal · 0.59mm/px · 3 of 124 slices shown]
[im 25/124  lung]
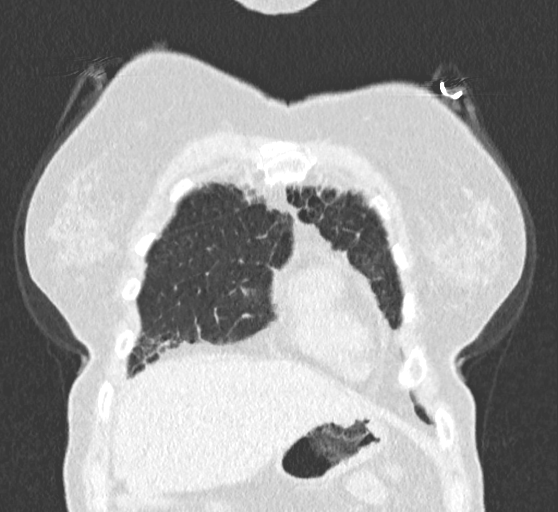
[im 50/124  lung]
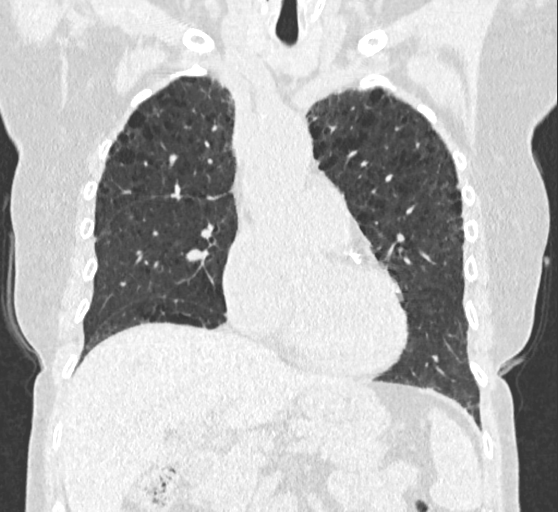
[im 74/124  lung]
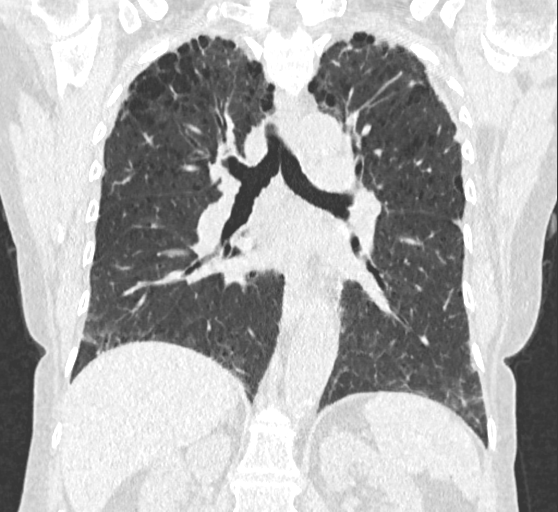

[Series 6: lung mips · axial · 0.50mm/px · z∈[+903,+1034]mm · 3 of 37 slices shown, 4 images]
[im 10/37  mediastinal]
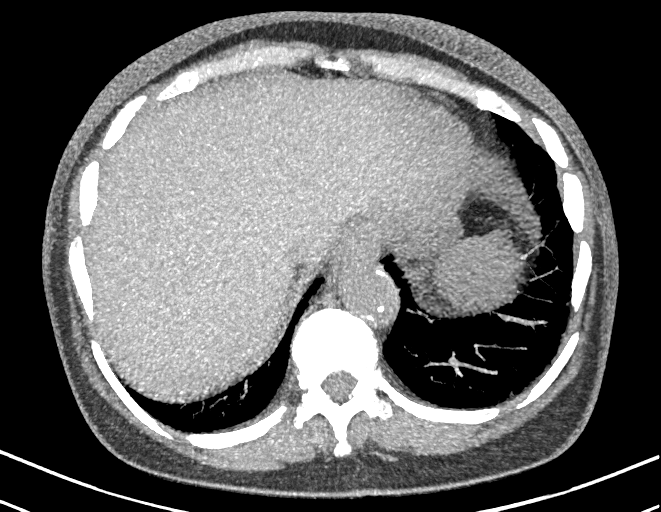
[im 10/37  lung]
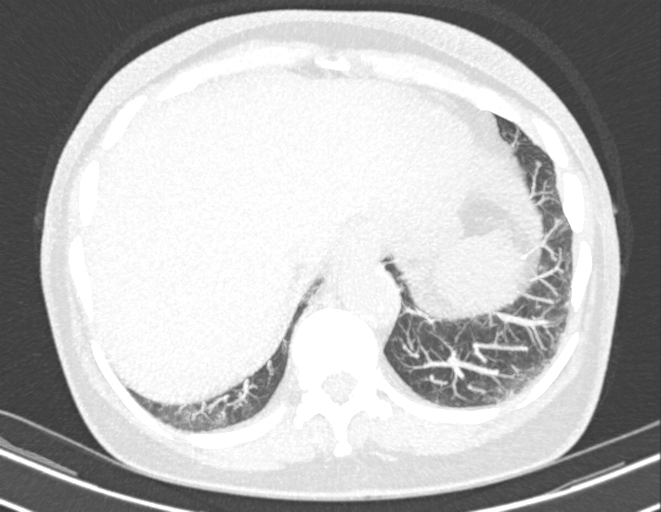
[im 19/37  lung]
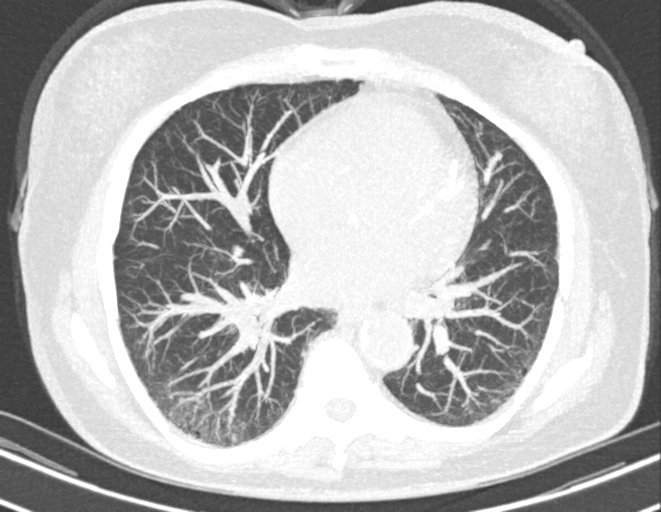
[im 28/37  lung]
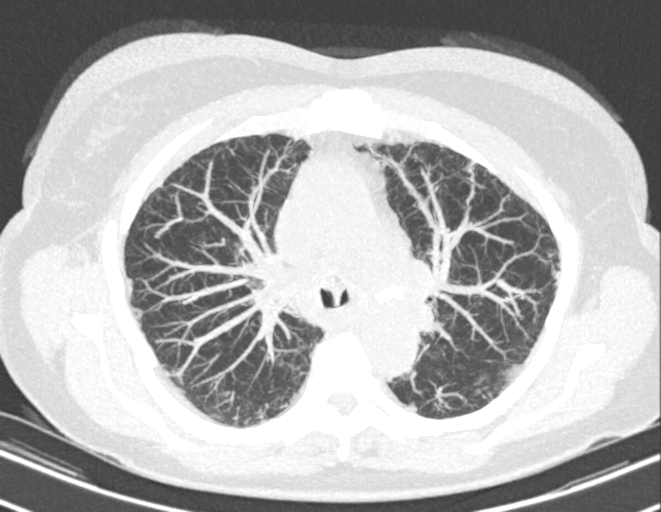

[6 of 36 positions shown; findings below may reference images not displayed]

FINDINGS: LUNGS AND PLEURA:  Moderate emphysematous changes in lungs with some 
honeycombing in the periphery in the lower lobes and slight prominence of 
interstitial markings or fibrosis at the lung bases. No consolidations or 
effusions. No pulmonary nodules. Apical scarring is present. No pleural 
effusion.  
MEDIASTINUM:  No adenopathy. Normal heart size. No pericardial effusion. 
Moderate coronary artery calcifications noted. 
CHEST WALL/AXILLA: No mass or adenopathy.  
UPPER ABDOMEN: Negative. 
MUSCULOSKELETAL: No acute abnormality.
IMPRESSION: Moderate emphysematous changes in lungs with some honeycombing in the periphery 
in the lower lobes and slight prominence of interstitial markings or fibrosis at 
the lung bases.  
No pulmonary nodules.  
Apical scarring is present.  
L-RADS: Category 1 (negative, <1% chance of malignancy) 
No lung nodules 
Lung nodule(s) with specific findings favoring benign nodule(s) 
Complete calcification 
Central calcification 
Popcorn calcification 
Calcification in concentric rings 
Fat-containing nodules 
Recommended follow up 
Category 1: continue annual screening with LDCT 

RADIATION DOSE REDUCTION: All CT scans are performed using radiation dose 
reduction techniques, when applicable.  Technical factors are evaluated and 
adjusted to ensure appropriate moderation of exposure.  Automated dose 
management technology is applied to adjust the radiation doses to minimize 
exposure while achieving diagnostic quality images.

## 2023-04-19 IMAGING — MR MRI CERVICAL SPINE WITHOUT CONTRAST
7 of 10 series · 11 of 48 positions shown · IV contrast (gadolinium)
Comparison: None

________________________________________________________________________________________________ 
MRI CERVICAL SPINE WITHOUT CONTRAST, 04/19/2023 [DATE]: 
CLINICAL INDICATION: Spondylosis w/o myelopathy or radiculopathy, cervical 
region , neck pain radiates to the right shoulder. No trauma.
TECHNIQUE: Multiplanar, multiecho position MR images of the cervical spine were 
performed without intravenous gadolinium enhancement. Patient was scanned on a 
1.5T magnet.

[Series 201: survey · axial · 10.0mm · 1.25mm/px · 1 of 10 slices shown]
[im 1/10]
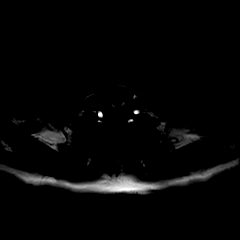

[Series 301: t2w_cor-surv · coronal · 5.0mm · 0.69mm/px · 1 of 6 slices shown]
[im 1/6]
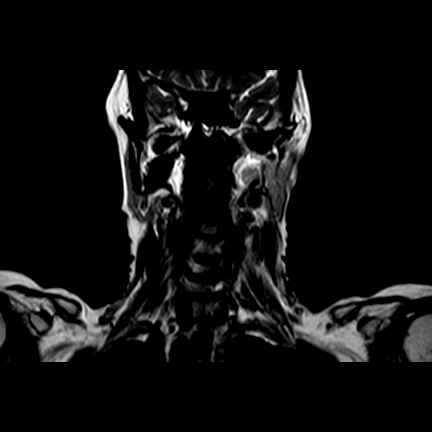

[Series 401: T1 · sagittal · 3.0mm · 0.39mm/px · 1 of 15 slices shown]
[im 1/15]
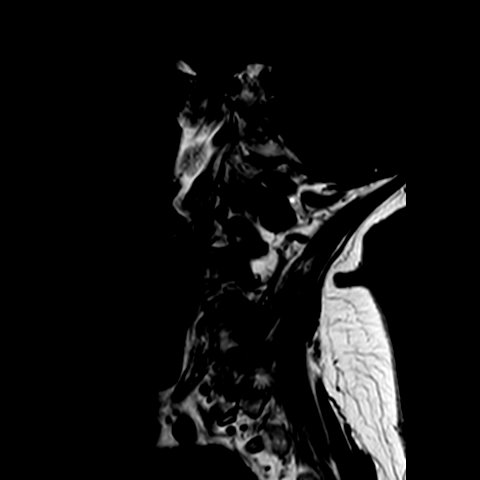

[Series 502: (id)_mdixon_tse · sagittal · 3.0mm · 0.35mm/px · 1 of 15 slices shown]
[im 1/15]
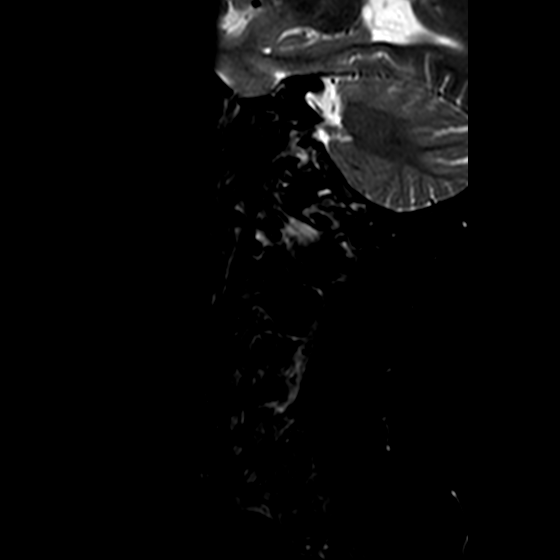

[Series 503: st2w_mdixon_tse · sagittal · 3.0mm · 0.35mm/px · 2 of 15 slices shown]
[im 1/15]
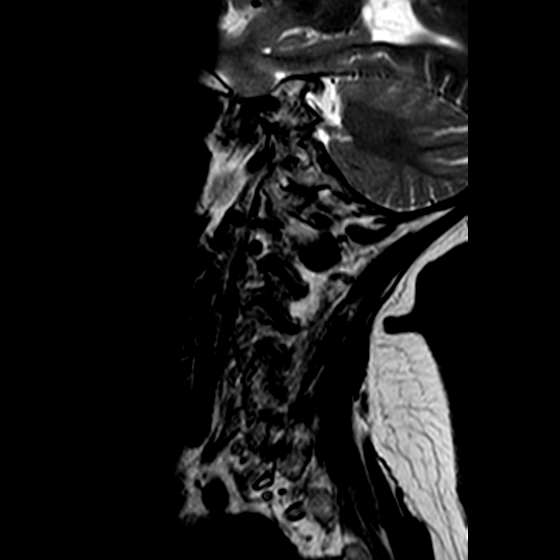
[im 15/15]
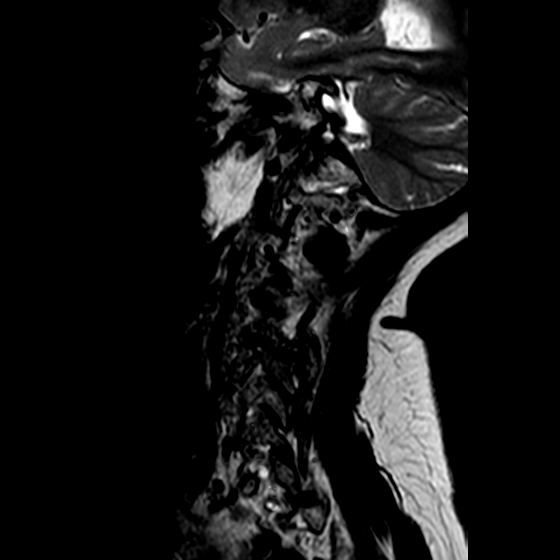

[Series 701: T2 · oblique · 3.0mm · 0.29mm/px · 2 of 18 slices shown]
[im 1/18]
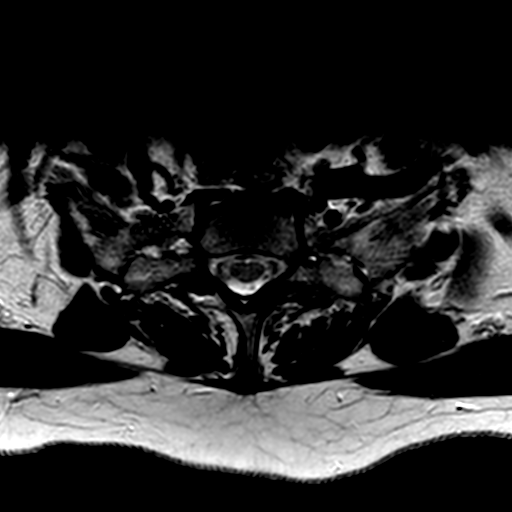
[im 18/18]
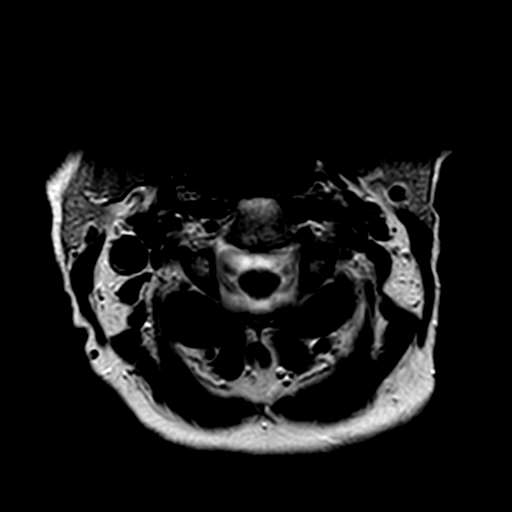

[Series 802: (person_name)_(person_name)_stack · axial · 3.0mm · 0.45mm/px · z∈[-227,-137]mm · 3 of 30 slices shown]
[im 1/30]
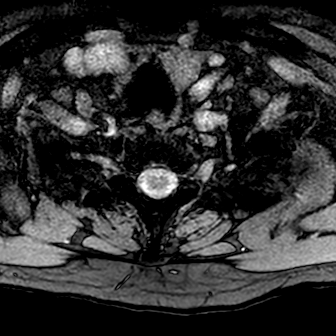
[im 15/30]
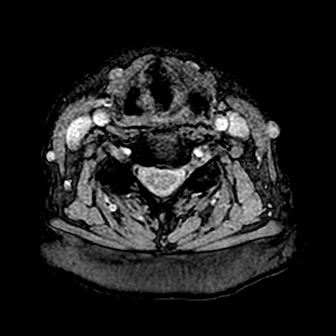
[im 30/30]
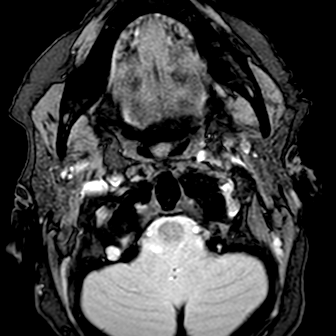

[11 of 48 positions shown; findings below may reference images not displayed]

FINDINGS: -------------------------------------------------------------------------------- 
----------------- 
GENERAL: 
ALIGNMENT: Mild dextroconvex cervical thoracic scoliosis. Slight loss of the 
normal cervical lordosis with trace grade 1 anterolisthesis C4 on C5 and trace 
grade 1 listhesis C5 on C6. 
VERTEBRAL BODY HEIGHT: Normal.  
MARROW SIGNAL: No focal suspect signal abnormality. 
CORD SIGNAL: Normal.  
ADDITIONAL FINDINGS: None. 
-------------------------------------------------------------------------------- 
---------------- 
SEGMENTAL: 
CRANIOCERVICAL JUNCTION: No significant stenosis. 
C2-C3: Normal disc height. No herniation. Normal facets. No spinal canal or 
neural foraminal stenosis. 
C3-C4: Mild loss of disc height. Minimal disc osteophyte complex. Canal patent. 
Bilateral uncinate spurring and facet arthropathy. The right foramen is patent. 
Moderate left foraminal narrowing. 
C4-C5: Normal disc height. Canal patent. Mild right-sided uncinate spurring and 
right-sided facet arthropathy creates borderline right foraminal narrowing. Left 
foramen is patent. 
C5-C6: Significant loss of disc height. Marginal osteophytes. Disc osteophyte 
complex abuts and flattens the ventral cord margin with mild to moderate canal 
stenosis. Left greater than right uncinate spurring and facet arthropathy create 
severe left and moderate right foraminal narrowing. 
C6-C7: Loss of disc height. Slight ligamentum flavum hypertrophy. Disc 
osteophyte complex flattens the ventral cord margin with mild to moderate canal 
stenosis. Bilateral uncinate spurring. Normal-appearing facets. Foramina are 
severely narrowed on the right and moderate to severely narrowed on the left. 
Correlate for C7 nerve root involvement. 
C7-T1: Mild left foraminal narrowing. Normal disc height. Patent canal and right 
foramen. Right-sided nerve root sleeve cyst noted. Normal facets. 
Visualized upper thoracic segments are unremarkable other than a left-sided 
nerve root sleeve cyst at T1-T2. 
-------------------------------------------------------------------------------- 
---------------
IMPRESSION: Multilevel cervical spondylosis. Mild to moderate canal stenosis is present 
C5-C6 and C6-C7 with ventral cord flattening at these levels. 
C6-C7, findings that could affect the exiting C7 nerve roots bilaterally, worse 
on the right. Other significant foraminal narrowing as above.

## 2023-05-03 IMAGING — MG MAMMOGRAPHY SCREENING BILATERAL 3[PERSON_NAME]
8 series · 8 of 24 positions shown · non-contrast
Comparison: None

________________________________________________________________________________________________ 
MAMMOGRAPHY SCREENING BILATERAL 3MALELY EIDER, 05/03/2023 [DATE]: 
CLINICAL INDICATION: Encounter for screening mammogram.
TECHNIQUE: Digital bilateral mammograms and 3-D Tomosynthesis were obtained. 
These were interpreted both primarily and with the aid of computer-aided 
detection system.  
BREAST DENSITY: (Level C) The breasts are heterogeneously dense, which may 
obscure small masses.

[L MLO]
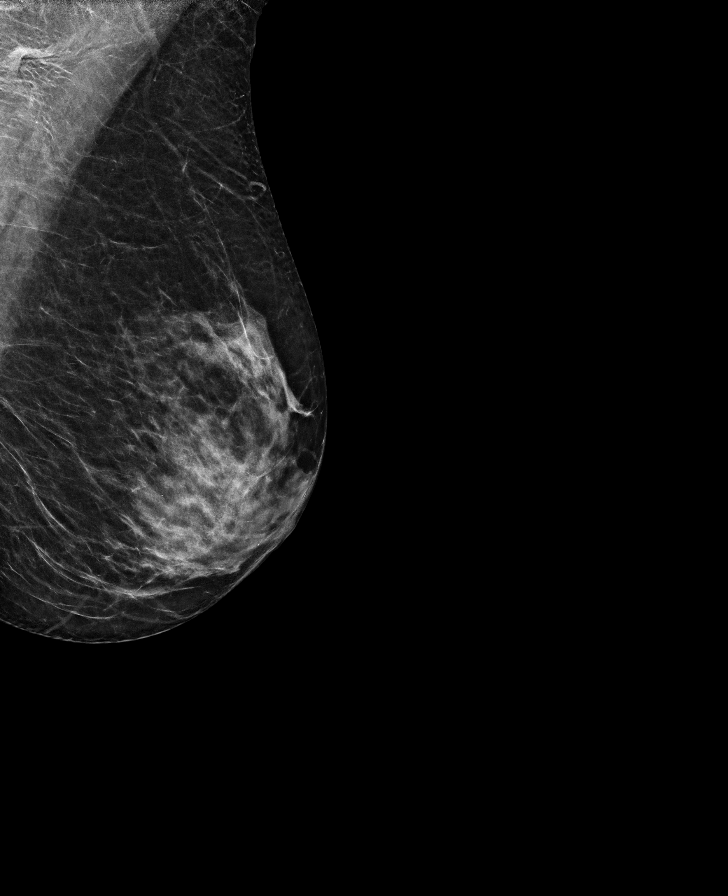

[L CC]
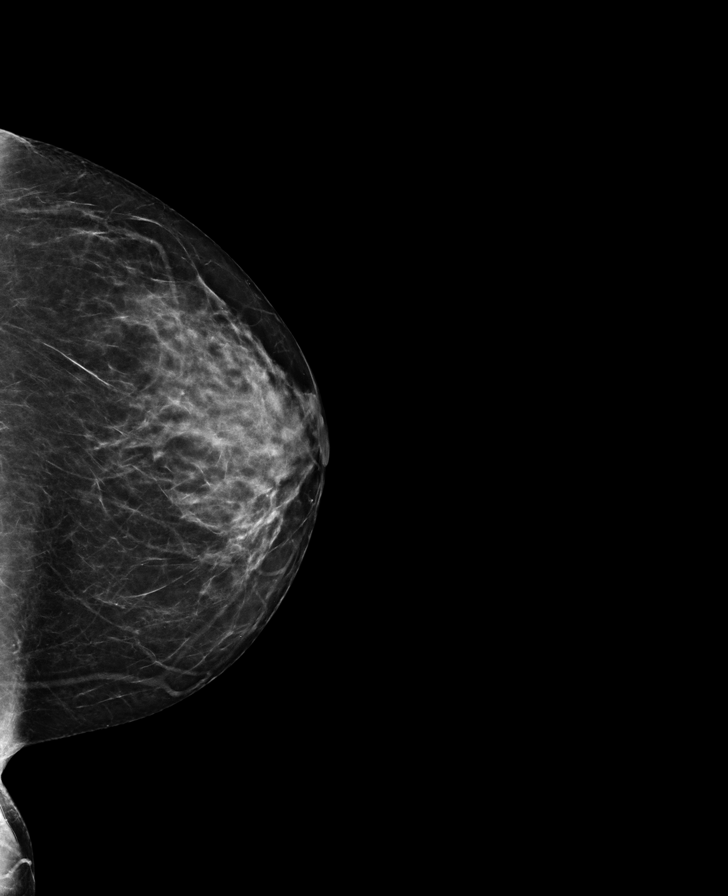

[R CC]
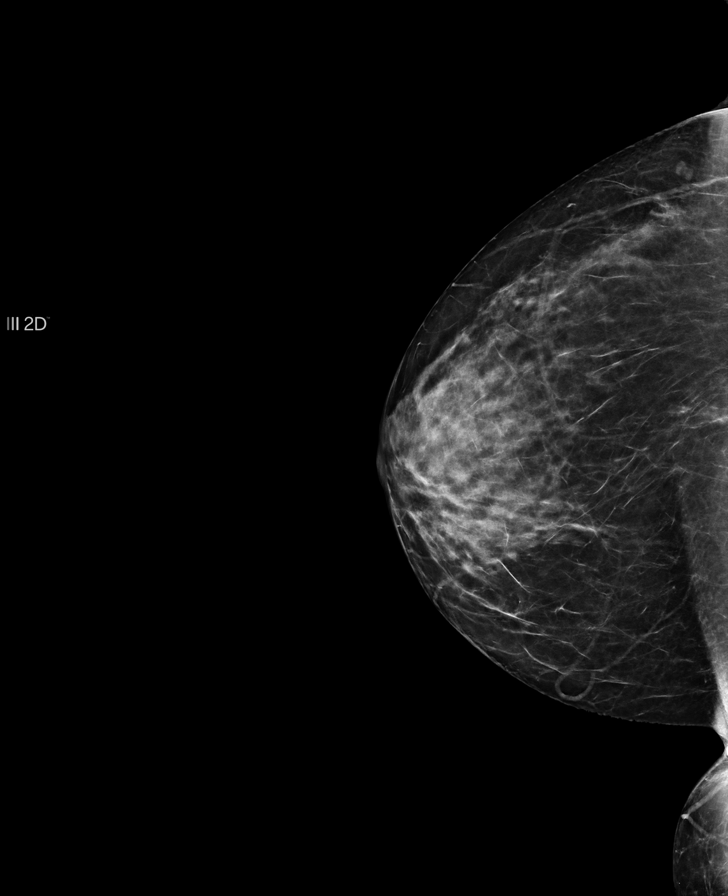

[R MLO]
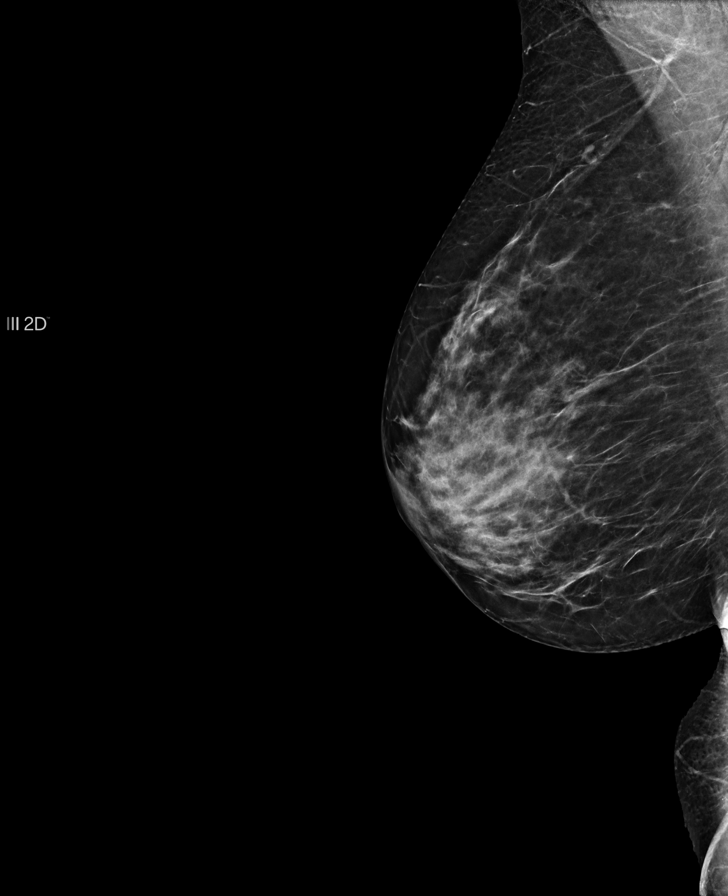

[R MLO tomo · tomo slice 11/21.0]
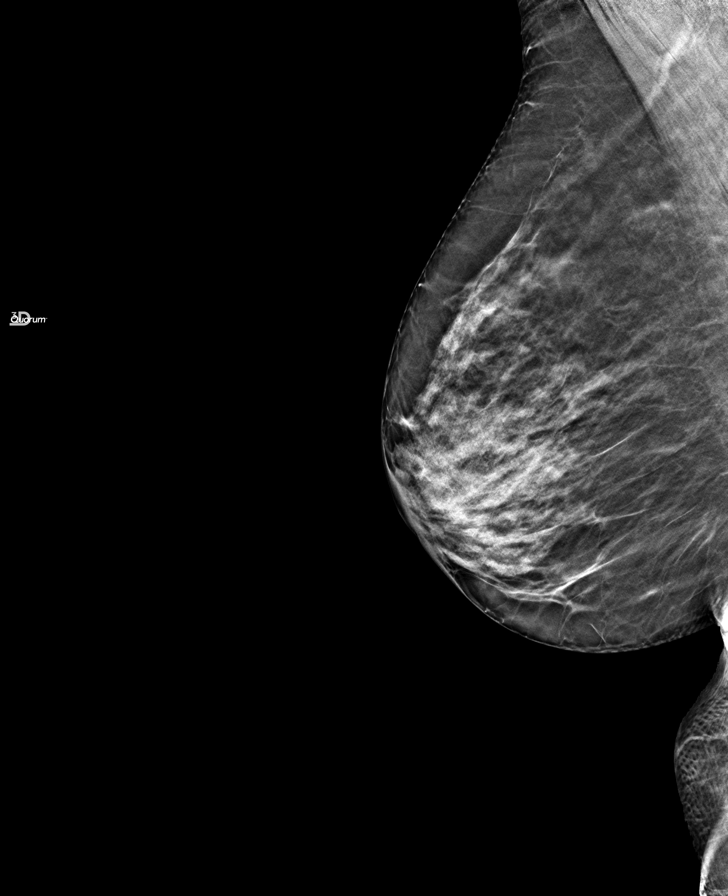

[L CC tomo · tomo slice 11/21.0]
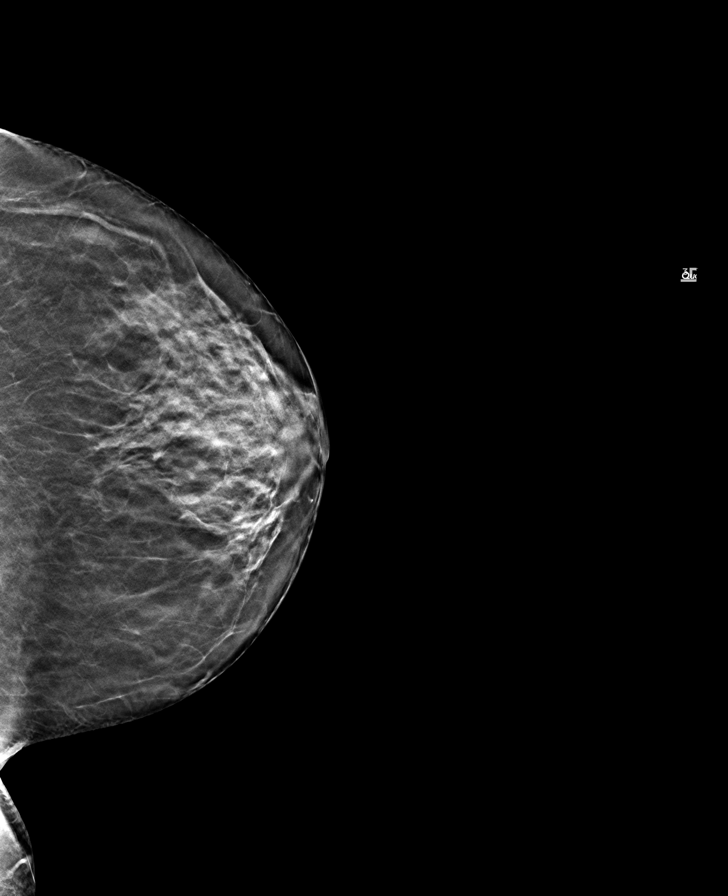

[L MLO tomo · tomo slice 11/21.0]
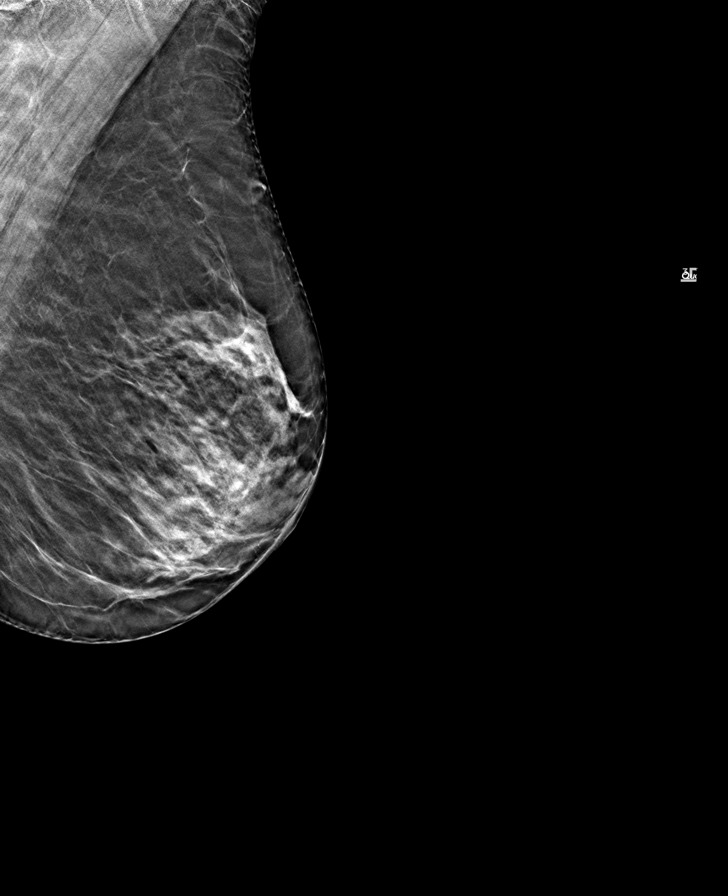

[R CC tomo · tomo slice 11/21.0]
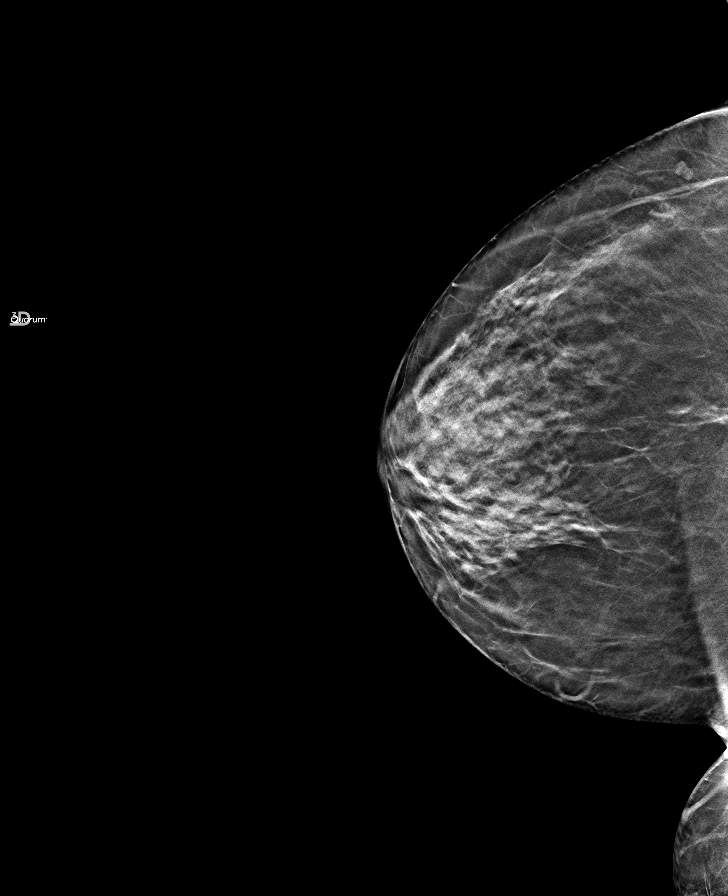

[8 of 24 positions shown; findings below may reference images not displayed]

FINDINGS: No suspicious mass, calcifications, or area of architectural 
distortion in either breast.
IMPRESSION: (BI-RADS 1) Negative mammogram. Routine mammographic follow-up is recommended.
